# Patient Record
Sex: Female | Born: 1950 | Race: White | Hispanic: No | Marital: Married | Smoking: Former smoker
Health system: Southern US, Community
[De-identification: ages and names within clinical notes are randomized; demographics above are authoritative.]

## PROBLEM LIST (undated history)

## (undated) DIAGNOSIS — Z8669 Personal history of other diseases of the nervous system and sense organs: Secondary | ICD-10-CM

## (undated) DIAGNOSIS — Z87442 Personal history of urinary calculi: Secondary | ICD-10-CM

## (undated) DIAGNOSIS — F329 Major depressive disorder, single episode, unspecified: Secondary | ICD-10-CM

## (undated) DIAGNOSIS — M810 Age-related osteoporosis without current pathological fracture: Secondary | ICD-10-CM

## (undated) DIAGNOSIS — Z8619 Personal history of other infectious and parasitic diseases: Secondary | ICD-10-CM

## (undated) DIAGNOSIS — G43909 Migraine, unspecified, not intractable, without status migrainosus: Secondary | ICD-10-CM

## (undated) DIAGNOSIS — I10 Essential (primary) hypertension: Secondary | ICD-10-CM

## (undated) DIAGNOSIS — Z79899 Other long term (current) drug therapy: Secondary | ICD-10-CM

## (undated) DIAGNOSIS — K635 Polyp of colon: Secondary | ICD-10-CM

## (undated) DIAGNOSIS — E559 Vitamin D deficiency, unspecified: Secondary | ICD-10-CM

## (undated) DIAGNOSIS — E785 Hyperlipidemia, unspecified: Secondary | ICD-10-CM

## (undated) DIAGNOSIS — T7840XA Allergy, unspecified, initial encounter: Secondary | ICD-10-CM

## (undated) DIAGNOSIS — S82899A Other fracture of unspecified lower leg, initial encounter for closed fracture: Secondary | ICD-10-CM

## (undated) DIAGNOSIS — J449 Chronic obstructive pulmonary disease, unspecified: Secondary | ICD-10-CM

## (undated) DIAGNOSIS — K219 Gastro-esophageal reflux disease without esophagitis: Secondary | ICD-10-CM

## (undated) DIAGNOSIS — M542 Cervicalgia: Secondary | ICD-10-CM

## (undated) DIAGNOSIS — G43009 Migraine without aura, not intractable, without status migrainosus: Secondary | ICD-10-CM

## (undated) DIAGNOSIS — R9389 Abnormal findings on diagnostic imaging of other specified body structures: Secondary | ICD-10-CM

## (undated) DIAGNOSIS — F32A Depression, unspecified: Secondary | ICD-10-CM

## (undated) DIAGNOSIS — B019 Varicella without complication: Secondary | ICD-10-CM

## (undated) HISTORY — DX: Cervicalgia: M54.2

## (undated) HISTORY — DX: Migraine, unspecified, not intractable, without status migrainosus: G43.909

## (undated) HISTORY — PX: BREAST ENHANCEMENT SURGERY: SHX7

## (undated) HISTORY — DX: Allergy, unspecified, initial encounter: T78.40XA

## (undated) HISTORY — DX: Depression, unspecified: F32.A

## (undated) HISTORY — DX: Personal history of other infectious and parasitic diseases: Z86.19

## (undated) HISTORY — DX: Gastro-esophageal reflux disease without esophagitis: K21.9

## (undated) HISTORY — DX: Hyperlipidemia, unspecified: E78.5

## (undated) HISTORY — PX: TOTAL VAGINAL HYSTERECTOMY: SHX2548

## (undated) HISTORY — DX: Abnormal findings on diagnostic imaging of other specified body structures: R93.89

## (undated) HISTORY — DX: Chronic obstructive pulmonary disease, unspecified: J44.9

## (undated) HISTORY — PX: BILATERAL SALPINGOOPHORECTOMY: SHX1223

## (undated) HISTORY — DX: Personal history of urinary calculi: Z87.442

## (undated) HISTORY — DX: Other fracture of unspecified lower leg, initial encounter for closed fracture: S82.899A

## (undated) HISTORY — DX: Age-related osteoporosis without current pathological fracture: M81.0

## (undated) HISTORY — DX: Major depressive disorder, single episode, unspecified: F32.9

## (undated) HISTORY — DX: Vitamin D deficiency, unspecified: E55.9

## (undated) HISTORY — DX: Varicella without complication: B01.9

## (undated) HISTORY — DX: Personal history of other diseases of the nervous system and sense organs: Z86.69

## (undated) HISTORY — DX: Migraine without aura, not intractable, without status migrainosus: G43.009

## (undated) HISTORY — DX: Essential (primary) hypertension: I10

## (undated) HISTORY — DX: Other long term (current) drug therapy: Z79.899

## (undated) HISTORY — DX: Polyp of colon: K63.5

---

## 1995-01-27 HISTORY — PX: ANKLE SURGERY: SHX546

## 1995-01-27 HISTORY — PX: APPENDECTOMY: SHX54

## 1995-01-27 HISTORY — PX: VESICOVAGINAL FISTULA CLOSURE W/ TAH: SUR271

## 2001-02-28 ENCOUNTER — Other Ambulatory Visit: Admission: RE | Admit: 2001-02-28 | Discharge: 2001-02-28 | Payer: Self-pay | Admitting: Obstetrics and Gynecology

## 2001-03-10 ENCOUNTER — Encounter: Admission: RE | Admit: 2001-03-10 | Discharge: 2001-03-10 | Payer: Self-pay | Admitting: Obstetrics and Gynecology

## 2001-03-10 ENCOUNTER — Encounter: Payer: Self-pay | Admitting: Obstetrics and Gynecology

## 2001-05-19 ENCOUNTER — Ambulatory Visit (HOSPITAL_COMMUNITY): Admission: RE | Admit: 2001-05-19 | Discharge: 2001-05-19 | Payer: Self-pay | Admitting: Orthopedic Surgery

## 2001-05-19 ENCOUNTER — Encounter: Payer: Self-pay | Admitting: Orthopedic Surgery

## 2002-05-29 ENCOUNTER — Encounter: Payer: Self-pay | Admitting: Emergency Medicine

## 2002-05-29 ENCOUNTER — Emergency Department (HOSPITAL_COMMUNITY): Admission: EM | Admit: 2002-05-29 | Discharge: 2002-05-29 | Payer: Self-pay | Admitting: Emergency Medicine

## 2002-06-14 ENCOUNTER — Encounter: Admission: RE | Admit: 2002-06-14 | Discharge: 2002-06-14 | Payer: Self-pay | Admitting: Family Medicine

## 2002-06-14 ENCOUNTER — Encounter: Payer: Self-pay | Admitting: Family Medicine

## 2004-07-21 ENCOUNTER — Ambulatory Visit (HOSPITAL_COMMUNITY): Admission: RE | Admit: 2004-07-21 | Discharge: 2004-07-21 | Payer: Self-pay | Admitting: Obstetrics and Gynecology

## 2005-12-07 ENCOUNTER — Ambulatory Visit (HOSPITAL_COMMUNITY): Admission: RE | Admit: 2005-12-07 | Discharge: 2005-12-07 | Payer: Self-pay | Admitting: Obstetrics and Gynecology

## 2008-04-09 ENCOUNTER — Encounter: Admission: RE | Admit: 2008-04-09 | Discharge: 2008-04-09 | Payer: Self-pay | Admitting: Family Medicine

## 2008-06-27 ENCOUNTER — Ambulatory Visit (HOSPITAL_COMMUNITY): Admission: RE | Admit: 2008-06-27 | Discharge: 2008-06-27 | Payer: Self-pay | Admitting: Obstetrics and Gynecology

## 2008-07-02 ENCOUNTER — Encounter: Admission: RE | Admit: 2008-07-02 | Discharge: 2008-07-02 | Payer: Self-pay | Admitting: Family Medicine

## 2008-10-05 ENCOUNTER — Encounter: Admission: RE | Admit: 2008-10-05 | Discharge: 2008-10-05 | Payer: Self-pay | Admitting: Family Medicine

## 2008-10-11 ENCOUNTER — Ambulatory Visit: Payer: Self-pay | Admitting: Internal Medicine

## 2008-10-11 DIAGNOSIS — R918 Other nonspecific abnormal finding of lung field: Secondary | ICD-10-CM | POA: Insufficient documentation

## 2008-10-11 DIAGNOSIS — R053 Chronic cough: Secondary | ICD-10-CM | POA: Insufficient documentation

## 2008-10-11 DIAGNOSIS — I10 Essential (primary) hypertension: Secondary | ICD-10-CM | POA: Insufficient documentation

## 2008-10-11 DIAGNOSIS — E785 Hyperlipidemia, unspecified: Secondary | ICD-10-CM | POA: Insufficient documentation

## 2008-10-11 DIAGNOSIS — R930 Abnormal findings on diagnostic imaging of skull and head, not elsewhere classified: Secondary | ICD-10-CM | POA: Insufficient documentation

## 2008-10-11 DIAGNOSIS — R05 Cough: Secondary | ICD-10-CM | POA: Insufficient documentation

## 2008-12-26 DIAGNOSIS — S82899A Other fracture of unspecified lower leg, initial encounter for closed fracture: Secondary | ICD-10-CM

## 2008-12-26 HISTORY — DX: Other fracture of unspecified lower leg, initial encounter for closed fracture: S82.899A

## 2008-12-28 ENCOUNTER — Encounter: Payer: Self-pay | Admitting: Internal Medicine

## 2008-12-28 ENCOUNTER — Ambulatory Visit: Payer: Self-pay | Admitting: Internal Medicine

## 2008-12-28 DIAGNOSIS — J449 Chronic obstructive pulmonary disease, unspecified: Secondary | ICD-10-CM | POA: Insufficient documentation

## 2008-12-28 DIAGNOSIS — J4489 Other specified chronic obstructive pulmonary disease: Secondary | ICD-10-CM | POA: Insufficient documentation

## 2009-01-16 ENCOUNTER — Emergency Department (HOSPITAL_BASED_OUTPATIENT_CLINIC_OR_DEPARTMENT_OTHER): Admission: EM | Admit: 2009-01-16 | Discharge: 2009-01-16 | Payer: Self-pay | Admitting: Emergency Medicine

## 2009-01-16 ENCOUNTER — Ambulatory Visit: Payer: Self-pay | Admitting: Diagnostic Radiology

## 2009-11-18 ENCOUNTER — Ambulatory Visit (HOSPITAL_COMMUNITY)
Admission: RE | Admit: 2009-11-18 | Discharge: 2009-11-18 | Payer: Self-pay | Source: Home / Self Care | Admitting: Family Medicine

## 2010-04-14 ENCOUNTER — Encounter: Payer: Self-pay | Admitting: Internal Medicine

## 2010-04-14 ENCOUNTER — Encounter: Payer: Self-pay | Admitting: Pulmonary Disease

## 2010-04-14 ENCOUNTER — Telehealth (INDEPENDENT_AMBULATORY_CARE_PROVIDER_SITE_OTHER): Payer: Self-pay | Admitting: *Deleted

## 2010-04-14 ENCOUNTER — Ambulatory Visit (INDEPENDENT_AMBULATORY_CARE_PROVIDER_SITE_OTHER): Payer: Federal, State, Local not specified - PPO | Admitting: Pulmonary Disease

## 2010-04-14 ENCOUNTER — Other Ambulatory Visit: Payer: Self-pay | Admitting: Pulmonary Disease

## 2010-04-14 ENCOUNTER — Other Ambulatory Visit: Payer: Federal, State, Local not specified - PPO

## 2010-04-14 ENCOUNTER — Other Ambulatory Visit: Payer: Self-pay | Admitting: *Deleted

## 2010-04-14 ENCOUNTER — Ambulatory Visit (INDEPENDENT_AMBULATORY_CARE_PROVIDER_SITE_OTHER)
Admission: RE | Admit: 2010-04-14 | Discharge: 2010-04-14 | Disposition: A | Discharge status: Home / Self Care | Payer: Federal, State, Local not specified - PPO | Source: Ambulatory Visit | Attending: *Deleted | Admitting: *Deleted

## 2010-04-14 DIAGNOSIS — J189 Pneumonia, unspecified organism: Secondary | ICD-10-CM | POA: Insufficient documentation

## 2010-04-14 DIAGNOSIS — J449 Chronic obstructive pulmonary disease, unspecified: Secondary | ICD-10-CM

## 2010-04-14 DIAGNOSIS — J159 Unspecified bacterial pneumonia: Secondary | ICD-10-CM

## 2010-04-14 DIAGNOSIS — R918 Other nonspecific abnormal finding of lung field: Secondary | ICD-10-CM

## 2010-04-14 LAB — CBC WITH DIFFERENTIAL/PLATELET
Basophils Absolute: 0.1 10*3/uL (ref 0.0–0.1)
Hemoglobin: 12.9 g/dL (ref 12.0–15.0)
Lymphocytes Relative: 10.6 % — ABNORMAL LOW (ref 12.0–46.0)
Monocytes Relative: 5.9 % (ref 3.0–12.0)
Platelets: 469 10*3/uL — ABNORMAL HIGH (ref 150.0–400.0)
RDW: 13.1 % (ref 11.5–14.6)

## 2010-04-14 LAB — BASIC METABOLIC PANEL
CO2: 26 mEq/L (ref 19–32)
Chloride: 98 mEq/L (ref 96–112)
Potassium: 4.1 mEq/L (ref 3.5–5.1)
Sodium: 134 mEq/L — ABNORMAL LOW (ref 135–145)

## 2010-04-14 NOTE — Assessment & Plan Note (Signed)
No meds required,

## 2010-04-14 NOTE — Progress Notes (Signed)
  Subjective:    Patient ID: Yolanda Copeland, female    DOB: December 05, 1950, 60 y.o.   MRN: [id number]  HPI 54 /F quit smoking 12/09 with mild COPD, preserved FEV1, last seen 12/10  Pulm nodules noted on CT in 12/10 04/14/2010  - Acute visit Fevers about 10 ds ago - viral syndrome per dr Ferol Luz Sick x 1 week, saw dr Clyde Canterbury - z-pak x 3ds, high temp - taking advil, mucinex - flecks of green  Low BP  Off losartan now  COntinued to feel worse    Review of Systems  Constitutional: Positive for fever and diaphoresis. Negative for weight loss.  HENT: Negative for nosebleeds, congestion, sore throat and ear discharge.   Eyes: Negative for pain and discharge.  Respiratory: Positive for cough, sputum production and shortness of breath. Negative for hemoptysis and wheezing.   Cardiovascular: Negative for chest pain, palpitations and leg swelling.  Gastrointestinal: Negative for nausea, vomiting and abdominal pain.  Genitourinary: Negative for dysuria.  Musculoskeletal: Negative for back pain and joint pain.  Skin: Negative for rash.  Neurological: Positive for weakness. Negative for focal weakness.   Review of Systems  Constitutional: Positive for fever and diaphoresis. Negative for weight loss.  HENT: Negative for nosebleeds, congestion, sore throat and ear discharge.   Eyes: Negative for pain and discharge.  Respiratory: Positive for cough, sputum production and shortness of breath. Negative for hemoptysis and wheezing.   Cardiovascular: Negative for chest pain, palpitations and leg swelling.  Gastrointestinal: Negative for nausea, vomiting and abdominal pain.  Genitourinary: Negative for dysuria.  Musculoskeletal: Negative for back pain and joint pain.  Skin: Negative for rash.  Neurological: Positive for weakness. Negative for focal weakness.       Objective:   Physical Exam Gen. Pleasant, well-nourished, in no distress, normal affect ENT - no lesions, no post nasal drip Neck: No  JVD, no thyromegaly, no carotid bruits Lungs: no use of accessory muscles, no dullness to percussion, clear without rales or rhonchi  Cardiovascular: Rhythm regular, heart sounds  normal, no murmurs or gallops, no peripheral edema Abdomen: soft and non-tender, no hepatosplenomegaly, BS normal. Musculoskeletal: No deformities, no cyanosis or clubbing Neuro:  alert, non focal        Assessment & Plan:

## 2010-04-14 NOTE — Patient Instructions (Signed)
1)  Copy sent to: 2)  Chest Xray shows pneumonia in right lung 3)  Antibiotic shot 4)  Take pills x 10 days 5)  Return in 2 days 6)  Call if Bp remains low or if worse 7)  Plenty of oral fluids 8)  Stay off losartan & advil 9)  Take tylenol for fevers

## 2010-04-14 NOTE — Assessment & Plan Note (Signed)
RML, Ceftriaxone IM now Levaquin 500 x 10 ds FU CXR & evaln in 2-3 days CBC, BMET now COntinue to hold losartan Call if worse, may need admission , if no response in 2 days- hope to defervesce

## 2010-04-14 NOTE — Assessment & Plan Note (Signed)
Will need FU CT chest once pneumonia resolved

## 2010-04-16 ENCOUNTER — Ambulatory Visit (INDEPENDENT_AMBULATORY_CARE_PROVIDER_SITE_OTHER): Payer: Federal, State, Local not specified - PPO | Admitting: Pulmonary Disease

## 2010-04-16 ENCOUNTER — Encounter: Payer: Self-pay | Admitting: Pulmonary Disease

## 2010-04-16 ENCOUNTER — Ambulatory Visit (INDEPENDENT_AMBULATORY_CARE_PROVIDER_SITE_OTHER)
Admission: RE | Admit: 2010-04-16 | Discharge: 2010-04-16 | Disposition: A | Discharge status: Home / Self Care | Payer: Federal, State, Local not specified - PPO | Source: Ambulatory Visit | Attending: Pulmonary Disease | Admitting: Pulmonary Disease

## 2010-04-16 VITALS — BP 106/64 | HR 107 | Temp 99.4°F | Ht 65.0 in | Wt 142.0 lb

## 2010-04-16 DIAGNOSIS — I10 Essential (primary) hypertension: Secondary | ICD-10-CM

## 2010-04-16 DIAGNOSIS — J189 Pneumonia, unspecified organism: Secondary | ICD-10-CM

## 2010-04-16 DIAGNOSIS — R059 Cough, unspecified: Secondary | ICD-10-CM

## 2010-04-16 DIAGNOSIS — R05 Cough: Secondary | ICD-10-CM

## 2010-04-16 NOTE — Assessment & Plan Note (Signed)
Symptomatic RX - mucinex DM daytime, ok for delsym at bedtime

## 2010-04-16 NOTE — Assessment & Plan Note (Signed)
Hold losartan until BP improves

## 2010-04-16 NOTE — Patient Instructions (Signed)
Return in 1 week with Tammy or me Stay off Bp medication Tylenol as needed for T 101 & above Take Mucinex daytime & DELSYM at bedtime

## 2010-04-16 NOTE — Assessment & Plan Note (Signed)
Improved in 48 h by symptoms Rpt CXR today Stay on levaquin

## 2010-04-16 NOTE — Progress Notes (Signed)
  Subjective:    Patient ID: Yolanda Copeland, female    DOB: Nov 21, 1950, 60 y.o.   MRN: [id number]  HPI59 /F quit smoking 12/09 with mild COPD, preserved FEV1, last seen 12/10  Pulm nodules noted on CT in 12/10  04/14/2010  - Acute visit Fevers about 10 ds ago - viral syndrome per dr Ferol Luz Sick x 1 week, saw dr Clyde Canterbury - z-pak x 3ds, high temp - taking advil, mucinex - flecks of green  Low BP  Off losartan now  CXR >> RML consolidation >> Im rocephin & PO levaquin, WC 15k  04/16/2010  Feeling better, temp down max of 99.8 , cough with white,foul smelling sputum No chest pain, dyspnea, not having to take advil, remians off losartan , BP improving   Review of Systems  Constitutional: Positive for fever. Negative for chills, appetite change and unexpected weight change.  HENT: Negative for mouth sores, voice change and postnasal drip.   Eyes: Negative for discharge.  Respiratory: Positive for cough. Negative for chest tightness, shortness of breath and wheezing.   Cardiovascular: Negative for chest pain, palpitations and leg swelling.  Gastrointestinal: Negative for abdominal pain.  Genitourinary: Negative for dysuria and frequency.  Musculoskeletal: Negative for joint swelling.  Skin: Negative for rash.  Neurological: Negative for syncope and headaches.  Psychiatric/Behavioral: Negative for confusion. The patient is not nervous/anxious.        Objective:   Physical Exam Gen. Pleasant, well-nourished, in no distress, normal affect ENT - no lesions, no post nasal drip Neck: No JVD, no thyromegaly, no carotid bruits Lungs: no use of accessory muscles, no dullness to percussion, clear without rales or rhonchi , increased vocal resonance rt base Cardiovascular: Rhythm regular, heart sounds  normal, no murmurs or gallops, no peripheral edema Abdomen: soft and non-tender, no hepatosplenomegaly, BS normal. Musculoskeletal: No deformities, no cyanosis or clubbing Neuro:  alert, non  focal         Assessment & Plan:

## 2010-04-24 NOTE — Progress Notes (Signed)
Summary: appt.  Phone Note Call from Patient Call back at [facility] Phone [phone]   Caller: Patient Call For: wert Reason for Call: Talk to Nurse Summary of Call: Patient c/o fever and cough x1 week.  She saw her primary care doctor last Wed and was told she has pneumonia--give zpack.  Patient calling saying she is not any better this morning and is requesting appt. Initial call taken by: Lehman Prom,  April 14, 2010 8:21 AM  Follow-up for Phone Call        Called, spoke with pt.  c/o fever, nonprod cough, sweats, chest tightness, wheezing, increased SOB.  Sxs started on Wednesday.  She was seen by PCP on Wednesday and Friday and was told she has pna.  States she was given z pak but not feeling any better.  Requesting to be seen.  Scheduled with RA today as MW has no openings.   Follow-up by: Gweneth Dimitri RN,  April 14, 2010 9:47 AM

## 2010-04-24 NOTE — Assessment & Plan Note (Signed)
Summary: COUGH, FEVER, DX WITH PNA BY PCP/cj/LED   Visit Type:  Acute   CC:  Wert pt. c/o productive cough, fever T-102, fatigue, sweats, and DOE.  Preventive Screening-Counseling & Management  Alcohol-Tobacco     Smoking Status: quit  Current Medications (verified): 1)  Centrum Silver  Tabs (Multiple Vitamins-Minerals) .Marland Kitchen.. 1 Once Daily 2)  Viactiv 500-500-40 Mg-Unt-Mcg Chew (Calcium-Vitamin D-Vitamin K) .... Take 1 Tablet By Mouth Two Times A Day 3)  Vitamin D3 2000 Unit Caps (Cholecalciferol) .Marland Kitchen.. 1 Once Daily 4)  Losartan Potassium 50 Mg Tabs (Losartan Potassium) .Marland Kitchen.. 1 Once Daily 5)  Estradiol 1 Mg Tabs (Estradiol) .Marland Kitchen.. 1 Once Daily 6)  Lipitor 20 Mg Tabs (Atorvastatin Calcium) .Marland Kitchen.. 1 Once Daily 7)  Advil Pm 200-38 Mg Tabs (Ibuprofen-Diphenhydramine Cit) .... Take 1 Tab By Mouth At Bedtime 8)  Boniva 150 Mg Tabs (Ibandronate Sodium) .... Once A Month  Allergies (verified): No Known Drug Allergies  Social History: Smoking Status:  quit  Vital Signs:  Patient profile:   60 year old female Height:      65 inches Weight:      143.2 pounds BMI:     23.92 O2 Sat:      95 % on Room air Temp:     101.3 degrees F oral Pulse rate:   125 / minute BP sitting:   90 / 70  (left arm) Cuff size:   regular  Vitals Entered By: Zackery Barefoot CMA (April 14, 2010 11:17 AM)  O2 Flow:  Room air CC: Wert pt. c/o productive cough, fever T-102, fatigue, sweats, DOE Comments Medications reviewed with patient Verified contact number and pharmacy with patient Zackery Barefoot CMA  April 14, 2010 11:17 AM     Medications Added to Medication List This Visit: 1)  Viactiv 500-500-40 Mg-unt-mcg Chew (Calcium-vitamin d-vitamin k) .... Take 1 tablet by mouth two times a day 2)  Advil Pm 200-38 Mg Tabs (Ibuprofen-diphenhydramine cit) .... Take 1 tab by mouth at bedtime 3)  Boniva 150 Mg Tabs (Ibandronate sodium) .... Once a month 4)  Levaquin 500 Mg Tabs (Levofloxacin) .... Once  daily  Other Orders: T-2 View CXR (71020TC) TLB-CBC Platelet - w/Differential (85025-CBCD) TLB-BMP (Basic Metabolic Panel-BMET) (80048-METABOL) Est. Patient Level V (95621) Prescription Created Electronically [id number]  Patient Instructions: 1)  Copy sent to: 2)  Chest Xray shows pneumonia in right lung 3)  Antibiotic shot 4)  Take pills x 10 days 5)  Return in 2 days 6)  Call if Bp remains low or if worse 7)  Plenty of oral fluids 8)  Stay off losartan & advil 9)  Take tylenol for fevers Prescriptions: LEVAQUIN 500 MG TABS (LEVOFLOXACIN) once daily  #10 x 0   Entered and Authorized by:   Comer Locket Vassie Loll MD   Signed by:   Comer Locket Vassie Loll MD on 04/14/2010   Method used:   Electronically to        [id number] ([facility])       [address]       [place], [place]  [address]       Ph: [phone] or [phone]       Fax: [phone]   RxID:   [id number]    Immunization History:  Influenza Immunization History:    Influenza:  historical (11/26/2009)   Appended Document: Orders Update    Clinical Lists Changes  Orders: Added new Service order of Rocephin  250mg  (W1191) - Signed Added new Service order of Admin of Therapeutic Inj  intramuscular or subcutaneous ([id number]) - Signed       Medication Administration  Injection # 1:    Medication: Rocephin  250mg     Diagnosis: BACTERIAL PNEUMONIA (ICD-482.9)    Route: IM    Site: LUOQ gluteus    Exp Date: 05/2011    Lot #: [id number]    Mfr: Novaplus    Patient tolerated injection without complications    Given by: Zackery Barefoot CMA (April 14, 2010 12:34 PM)  Orders Added: 1)  Rocephin  250mg  [[id number]] 2)  Admin of Therapeutic Inj  intramuscular or subcutaneous [[id number]]

## 2010-04-29 ENCOUNTER — Ambulatory Visit (INDEPENDENT_AMBULATORY_CARE_PROVIDER_SITE_OTHER): Payer: Federal, State, Local not specified - PPO | Admitting: Adult Health

## 2010-04-29 ENCOUNTER — Encounter: Payer: Self-pay | Admitting: Adult Health

## 2010-04-29 ENCOUNTER — Ambulatory Visit (INDEPENDENT_AMBULATORY_CARE_PROVIDER_SITE_OTHER)
Admission: RE | Admit: 2010-04-29 | Discharge: 2010-04-29 | Disposition: A | Discharge status: Home / Self Care | Payer: Federal, State, Local not specified - PPO | Source: Ambulatory Visit | Attending: Adult Health | Admitting: Adult Health

## 2010-04-29 VITALS — BP 118/60 | HR 72 | Temp 98.3°F | Ht 65.0 in | Wt 140.8 lb

## 2010-04-29 DIAGNOSIS — J189 Pneumonia, unspecified organism: Secondary | ICD-10-CM

## 2010-04-29 DIAGNOSIS — I10 Essential (primary) hypertension: Secondary | ICD-10-CM

## 2010-04-29 NOTE — Progress Notes (Signed)
  Subjective:    Patient ID: Yolanda Copeland, female    DOB: 02/04/1950, 60 y.o.   MRN: [id number]  HPI  Subjective:   59/F quit smoking 12/09 with mild COPD, preserved FEV1, last seen 12/10  Pulm nodules noted on CT in 12/10  04/14/2010  - Acute visit Fevers about 10 ds ago - viral syndrome per dr Ferol Luz Sick x 1 week, saw dr Clyde Canterbury - z-pak x 3ds, high temp - taking advil, mucinex - flecks of green  Low BP  Off losartan now  CXR >> RML consolidation >> Im rocephin & PO levaquin, WC 15k  04/16/2010 Follow Up  Feeling better, temp down max of 99.8 , cough with white,foul smelling sputum  04/29/10 Follow up  Pt returns today for follow up of PNA .She is feeling better with decreased cough and congestion. NO fever  Or discolored mucus. Last office visit dx with RML PNA , She has finished her full course of Levaquin. Appetite is improved.  Still feels tired but not as weak. Blood pressure had been low on initial visit. Losartan was held.  Her b/p has been improved on average ~100-120 systolic. No chest pain, hemoptysis or abdominal pain.   ROS:   Constitutional:   No  weight loss, night sweats,  Fevers, chills, fatigue, or  lassitude.  HEENT:   No headaches,  Difficulty swallowing,  Tooth/dental problems, or  Sore throat,                No sneezing, itching, ear ache, nasal congestion, post nasal drip,   CV:  No chest pain,  Orthopnea, PND, swelling in lower extremities, anasarca, dizziness, palpitations, syncope.   GI  No heartburn, indigestion, abdominal pain, nausea, vomiting, diarrhea, change in bowel habits, loss of appetite, bloody stools.   Resp: No shortness of breath with exertion or at rest.    No coughing up of blood.  No change in color of mucus.  No wheezing.  No chest wall deformity  Skin: no rash or lesions.  GU: no dysuria, change in color of urine, no urgency or frequency.  No flank pain, no hematuria   MS:  No joint pain or swelling.  No decreased range of  motion.  No back pain.  Psych:  No change in mood or affect. No depression or anxiety.  No memory loss.              Objective:   Physical Exam Gen. Pleasant, well-nourished, in no distress, normal affect ENT - no lesions, no post nasal drip Neck: No JVD, no thyromegaly, no carotid bruits Lungs: no use of accessory muscles, no dullness to percussion, clear without rales or rhonchi Cardiovascular: Rhythm regular, heart sounds  normal, no murmurs or gallops, no peripheral edema Abdomen: soft and non-tender, no hepatosplenomegaly, BS normal. Musculoskeletal: No deformities, no cyanosis or clubbing Neuro:  alert, non focal         Assessment & Plan:   Review of Systems     Objective:   Physical Exam          Assessment & Plan:

## 2010-04-29 NOTE — Assessment & Plan Note (Addendum)
Clinically improving. NOw finished full course of abx.  Will recheck CXR  Plan:  Cont on current regimen.  Follow cxr for clearance follow up 4 weeks and As needed

## 2010-04-29 NOTE — Patient Instructions (Addendum)
May restart Losartan 50mg  1/2 daily -call if blood pressure is >140 or <90  I will call with xray results.  follow up Dr. Vassie Loll   In 4 weeks and As needed

## 2010-05-04 NOTE — Assessment & Plan Note (Signed)
Improved  Can restart Losartan  follow up as planned

## 2010-05-05 ENCOUNTER — Telehealth: Payer: Self-pay | Admitting: Adult Health

## 2010-05-05 NOTE — Progress Notes (Signed)
Pt aware of cxr results per 05-05-10 phone note.

## 2010-05-05 NOTE — Telephone Encounter (Signed)
Spoke w/ pt and advised her of cxr results. Pt verbalized understanding. Pt has been seeing Dr. Vassie Loll and Dr. Sherene Sires. Pt has an apt w/ Dr. Sherene Sires 4/12 and 5/1 and w/ Dr. Vassie Loll 5/3. Pt is wanting to know which doctor she needs to stick w/ so she is not seeing 2 docs for the same thing. Pt also states she is still having night sweats. Pt states she will be out later and states we can leave her a detailed VM. Please advise Yolanda Copeland. Thanks.

## 2010-05-05 NOTE — Telephone Encounter (Signed)
Per TP: only keep the appt w/ MW on 05-27-10.  LMOM to inform pt what appt to keep.  Other appts cancelled.  Encouraged pt to call with any questions/concerns.  Will close encounter.

## 2010-05-06 ENCOUNTER — Encounter: Payer: Self-pay | Admitting: Internal Medicine

## 2010-05-08 ENCOUNTER — Ambulatory Visit: Payer: Federal, State, Local not specified - PPO | Admitting: Internal Medicine

## 2010-05-26 ENCOUNTER — Encounter: Payer: Self-pay | Admitting: Internal Medicine

## 2010-05-27 ENCOUNTER — Ambulatory Visit (INDEPENDENT_AMBULATORY_CARE_PROVIDER_SITE_OTHER): Payer: Federal, State, Local not specified - PPO | Admitting: Internal Medicine

## 2010-05-27 ENCOUNTER — Ambulatory Visit (INDEPENDENT_AMBULATORY_CARE_PROVIDER_SITE_OTHER)
Admission: RE | Admit: 2010-05-27 | Discharge: 2010-05-27 | Disposition: A | Discharge status: Home / Self Care | Payer: Federal, State, Local not specified - PPO | Source: Ambulatory Visit | Attending: Internal Medicine | Admitting: Internal Medicine

## 2010-05-27 ENCOUNTER — Encounter: Payer: Self-pay | Admitting: Internal Medicine

## 2010-05-27 VITALS — BP 120/60 | HR 78 | Temp 97.9°F | Wt 144.0 lb

## 2010-05-27 DIAGNOSIS — R918 Other nonspecific abnormal finding of lung field: Secondary | ICD-10-CM

## 2010-05-27 DIAGNOSIS — J159 Unspecified bacterial pneumonia: Secondary | ICD-10-CM

## 2010-05-27 DIAGNOSIS — R05 Cough: Secondary | ICD-10-CM

## 2010-05-27 DIAGNOSIS — R059 Cough, unspecified: Secondary | ICD-10-CM

## 2010-05-27 NOTE — Patient Instructions (Addendum)
Chlortrimeton 4 mg one at bedtime   Delsym cough syrup  Pepcid 20 mg one after bfast and at bedtime until no longer coughing at all.  I will call with your cxr results  You call me if not 100% satisfied with cough  Late Add:  Serial cxr's reviewed  - needs f/u cxr at 3 months and yearly, pt informed by phone

## 2010-05-27 NOTE — Progress Notes (Signed)
  Subjective:    Patient ID: Yolanda Copeland, female    DOB: 1950/06/09, 60 y.o.   MRN: [id number]  HPI Copy to: Dr. Shaune Pollack  .   58 yowf quit smoking 12/2007 then developed chronic cough a month after she quit.   October 11, 2008 ov c/o occ tickle  after rx with augmentin x 7 days in July 2010 > much better overall.   04/14/2010 - Acute visit  Fevers about 10 ds ago - viral syndrome per dr Ferol Luz  Sick x 1 week, saw dr Clyde Canterbury - z-pak x 3ds, high temp - taking advil, mucinex - flecks of green  Low BP Off losartan now  CXR >> RML consolidation >> Im rocephin & PO levaquin, WC 15k  rec complete Levaquin    04/29/10 ov  Cc f/u  PNA > better with decreased cough and congestion. NO fever  Or discolored mucus. Last office visit dx with RML PNA , She has finished her full course of Levaquin. Appetite is improved.  Still feels tired but not as weak. Blood pressure had been low on initial visit. Losartan was held.  Her b/p has been improved on average ~100-120 systolic. No chest pain, hemoptysis or abdominal pain. rec  restart Losartan 50mg  1/2 daily -call if blood pressure is >140 or <90  05/27/2010 ov/ Amato [place]  Cc cough still present but better > like a tickle in throat, minimal clear mucus, worse with crumbly food like carckers .  Sleeping ok without nocturnal  or early am exac of resp c/o's or need for noct saba.   Pt denies any significant sore throat, dysphagia, itching, sneezing,  nasal congestion or excess/ purulent secretions,  fever, chills, sweats, unintended wt loss, pleuritic or exertional cp, hempoptysis, orthopnea pnd or leg swelling.    Also denies any obvious fluctuation of symptoms with weather or environmental changes or other aggravating or alleviating factors.    Past Medical History:  Hyperlipidemia  Hypertension  ABN CXR  - No baseline available  - [place]ived in [place] previously   Past Surgical History:  Hysterectomy 1997  Breast augmentation x 2 1987 and in 2007    Appendectomy 1997   Family History:  Heart dz- Father  Social History:  Married  [employer]  Former smoker. Quit Dec 2009. Smoked on and off x 40 years up to 1/2 ppd.  Occ ETOH       Review of Systems     Objective:   Physical Exam  wt 143  >   144 05/27/2010  thin anxious but pleasant amb wf nad  HEENT: nl dentition, turbinates, and orophanx. Nl external ear canals without cough reflex  NECK : without JVD/Nodes/TM/ nl carotid upstrokes bilaterally  LUNGS: no acc muscle use, clear to A and P bilaterally without cough on insp or exp maneuvers  CV: RRR no s3 or murmur or increase in P2, no edema  ABD: soft and nontender with nl excursion in the supine position. No bruits or organomegaly, bowel sounds nl  MS: warm without deformities, calf tenderness, cyanosis or clubbing  SKIN: warm and dry without lesions  NEURO: alert, approp, no deficit     cxr 05/27/10 1. Decrease in size of vague right middle lobe opacity most likely  the sequelae of prior pneumonia.   Pulmonary review:  No further f/u needed   Assessment & Plan:

## 2010-05-27 NOTE — Progress Notes (Signed)
Quick Note:  Spoke with pt and notified of results per Dr. Wert. Pt verbalized understanding and denied any questions.  ______ 

## 2010-05-28 ENCOUNTER — Encounter: Payer: Self-pay | Admitting: Internal Medicine

## 2010-05-28 NOTE — Assessment & Plan Note (Signed)
The most common causes of chronic cough in immunocompetent adults include the following: upper airway cough syndrome (UACS), previously referred to as postnasal drip syndrome (PNDS), which is caused by variety of rhinosinus conditions; (2) asthma; (3) GERD; (4) chronic bronchitis from cigarette smoking or other inhaled environmental irritants; (5) nonasthmatic eosinophilic bronchitis; and (6) bronchiectasis.   These conditions, singly or in combination, have accounted for up to 94% of the causes of chronic cough in prospective studies.   Other conditions have constituted no >6% of the causes in prospective studies These have included bronchogenic carcinoma, chronic interstitial pneumonia, sarcoidosis, left ventricular failure, ACEI-induced cough, and aspiration from a condition associated with pharyngeal dysfunction.    This is most c/w  Classic Upper airway cough syndrome, so named because it's frequently impossible to sort out how much is  CR/sinusitis with freq throat clearing (which can be related to primary GERD)   vs  causing  secondary (" extra esophageal")  GERD from wide swings in gastric pressure that occur with throat clearing, often  promoting self use of mint and menthol lozenges that reduce the lower esophageal sphincter tone and exacerbate the problem further in a cyclical fashion.   These are the same pts who not infrequently have failed to tolerate ace inhibitors,  dry powder inhalers or biphosphonates or report having reflux symptoms that don't respond to standard doses of PPI , and are easily confused as having aecopd or asthma flares,   Try h1 Antihistamines  per guidelines and f/u if not 100% satisfied with ct sinus next

## 2010-05-28 NOTE — Assessment & Plan Note (Signed)
Serial cxr's show dramatic convincing response to rx with minimal rml residual, so ok to f/u prn cough at this point

## 2010-05-29 ENCOUNTER — Ambulatory Visit: Payer: Federal, State, Local not specified - PPO | Admitting: Pulmonary Disease

## 2010-12-26 ENCOUNTER — Other Ambulatory Visit: Payer: Self-pay | Admitting: Obstetrics and Gynecology

## 2010-12-26 DIAGNOSIS — Z1231 Encounter for screening mammogram for malignant neoplasm of breast: Secondary | ICD-10-CM

## 2011-01-30 ENCOUNTER — Ambulatory Visit (HOSPITAL_COMMUNITY)
Admission: RE | Admit: 2011-01-30 | Discharge: 2011-01-30 | Disposition: A | Discharge status: Home / Self Care | Payer: Federal, State, Local not specified - PPO | Source: Ambulatory Visit | Attending: Obstetrics and Gynecology | Admitting: Obstetrics and Gynecology

## 2011-01-30 DIAGNOSIS — Z1231 Encounter for screening mammogram for malignant neoplasm of breast: Secondary | ICD-10-CM | POA: Insufficient documentation

## 2011-07-10 ENCOUNTER — Other Ambulatory Visit: Payer: Self-pay | Admitting: Obstetrics and Gynecology

## 2011-07-10 DIAGNOSIS — N63 Unspecified lump in unspecified breast: Secondary | ICD-10-CM

## 2011-07-20 ENCOUNTER — Ambulatory Visit
Admission: RE | Admit: 2011-07-20 | Discharge: 2011-07-20 | Disposition: A | Discharge status: Home / Self Care | Payer: Federal, State, Local not specified - PPO | Source: Ambulatory Visit | Attending: Obstetrics and Gynecology | Admitting: Obstetrics and Gynecology

## 2011-07-20 DIAGNOSIS — N63 Unspecified lump in unspecified breast: Secondary | ICD-10-CM

## 2012-03-11 DIAGNOSIS — M25511 Pain in right shoulder: Secondary | ICD-10-CM | POA: Insufficient documentation

## 2012-10-21 ENCOUNTER — Other Ambulatory Visit: Payer: Self-pay | Admitting: Obstetrics and Gynecology

## 2012-10-21 DIAGNOSIS — Z1231 Encounter for screening mammogram for malignant neoplasm of breast: Secondary | ICD-10-CM

## 2012-10-31 ENCOUNTER — Ambulatory Visit (HOSPITAL_COMMUNITY)
Admission: RE | Admit: 2012-10-31 | Discharge: 2012-10-31 | Disposition: A | Discharge status: Home / Self Care | Payer: Federal, State, Local not specified - PPO | Source: Ambulatory Visit | Attending: Obstetrics and Gynecology | Admitting: Obstetrics and Gynecology

## 2012-10-31 DIAGNOSIS — Z1231 Encounter for screening mammogram for malignant neoplasm of breast: Secondary | ICD-10-CM

## 2013-03-10 ENCOUNTER — Encounter: Payer: Self-pay | Admitting: Gynecology

## 2013-03-29 ENCOUNTER — Ambulatory Visit: Payer: Self-pay | Admitting: Gynecology

## 2013-04-03 ENCOUNTER — Encounter: Payer: Self-pay | Admitting: Gynecology

## 2013-04-04 ENCOUNTER — Ambulatory Visit (INDEPENDENT_AMBULATORY_CARE_PROVIDER_SITE_OTHER): Payer: Federal, State, Local not specified - PPO | Admitting: Gynecology

## 2013-04-04 ENCOUNTER — Encounter: Payer: Self-pay | Admitting: Gynecology

## 2013-04-04 VITALS — BP 114/66 | HR 68 | Resp 18 | Ht 65.0 in | Wt 171.0 lb

## 2013-04-04 DIAGNOSIS — N951 Menopausal and female climacteric states: Secondary | ICD-10-CM

## 2013-04-04 DIAGNOSIS — F329 Major depressive disorder, single episode, unspecified: Secondary | ICD-10-CM | POA: Insufficient documentation

## 2013-04-04 DIAGNOSIS — Z0000 Encounter for general adult medical examination without abnormal findings: Secondary | ICD-10-CM

## 2013-04-04 DIAGNOSIS — F32A Depression, unspecified: Secondary | ICD-10-CM | POA: Insufficient documentation

## 2013-04-04 DIAGNOSIS — Z01419 Encounter for gynecological examination (general) (routine) without abnormal findings: Secondary | ICD-10-CM

## 2013-04-04 DIAGNOSIS — M810 Age-related osteoporosis without current pathological fracture: Secondary | ICD-10-CM

## 2013-04-04 LAB — POCT URINALYSIS DIPSTICK
PH UA: 5
UROBILINOGEN UA: NEGATIVE

## 2013-04-04 MED ORDER — ESTRADIOL 1 MG PO TABS: 1.0000 mg | ORAL_TABLET | Freq: Every day | ORAL | Status: DC

## 2013-04-04 NOTE — Progress Notes (Addendum)
63 y.o. Married Caucasian female   [phone] here for annual exam. Pt reports menses are absent due to Hysterectomy. She does not report post-menopasual bleeding. Pt stopped boniva after there was question regarding needing a dental implant but ended up not needing now.  Pt has an atraumatic fracture history.  Pt denies dyspareunia.     No LMP recorded. Patient has had a hysterectomy.          Sexually active: yes  The current method of family planning is status post hysterectomy.    Exercising: no  The patient does not participate in regular exercise at present. Last pap: 02/28/2001 Negative Abnormal PAP: no Mammogram: 10/31/12 Bi-Rads 1  BSE: yes  Colonoscopy: 2009 Normal  q 5 years DEXA: 2010/2011 Alcohol: 2-3 drinks/wk Tobacco: no  Labs: Darcus Austin, MD ; Urine: Leuks 2  Health Maintenance  Topic Date Due  . Colonoscopy  09/26/2002  . Pap Smear  02/29/2004  . Tetanus/tdap  01/27/2011  . Influenza Vaccine  08/26/2012  . Zostavax  09/25/2012  . Mammogram  11/01/2014    Family History  Problem Relation Age of Onset  . Heart disease Father   . Diabetes Maternal Grandmother   . Hypertension Father     Patient Active Problem List   Diagnosis Date Noted  . BACTERIAL PNEUMONIA 04/14/2010  . COPD UNSPECIFIED 12/28/2008  . HYPERLIPIDEMIA 10/11/2008  . HYPERTENSION 10/11/2008  . Cough 10/11/2008  . Multiple pulmonary nodules 10/11/2008  . ABNORMAL LUNG XRAY 10/11/2008    Past Medical History  Diagnosis Date  . Hyperlipemia   . Hypertension   . Abnormal CXR   . COPD (chronic obstructive pulmonary disease)   . Migraine   . Broken ankle 12/10    atraumatic fracture     Past Surgical History  Procedure Laterality Date  . Vesicovaginal fistula closure w/ tah  1997  . Breast enhancement surgery  1989, 2003    x2  . Appendectomy  1997  . Ankle surgery  1997    plate fell   . Total vaginal hysterectomy  1999    menorrhagia; suspicious cells in uterus     Allergies:  Review of patient's allergies indicates no known allergies.  Current Outpatient Prescriptions  Medication Sig Dispense Refill  . acetaminophen (TYLENOL) 500 MG tablet Take 500 mg by mouth every 6 (six) hours as needed.        Marland Kitchen atorvastatin (LIPITOR) 20 MG tablet Take 20 mg by mouth daily.        . calcium citrate-vitamin D (CITRACAL+D) 315-200 MG-UNIT per tablet Take 1 tablet by mouth daily.        . Cholecalciferol (VITAMIN D3) 2000 UNITS TABS Once daily       . DULoxetine HCl (CYMBALTA PO) Take by mouth.      . estradiol (ESTRACE) 1 MG tablet Take 1 mg by mouth daily.        Marland Kitchen ibandronate (BONIVA) 150 MG tablet Take 150 mg by mouth every 30 (thirty) days. Take in the morning with a full glass of water, on an empty stomach, and do not take anything else by mouth or lie down for the next 30 min.       Marland Kitchen losartan (COZAAR) 50 MG tablet Take 50 mg by mouth daily.        . Multiple Vitamins-Minerals (CENTRUM SILVER PO) Once daily       . TRAZODONE HCL PO Take by mouth.       No current  facility-administered medications for this visit.    ROS: Pertinent items are noted in HPI.  Exam:    BP 114/66  Pulse 68  Resp 18  Ht 5\' 5"  (1.651 m)  Wt 171 lb (77.565 kg)  BMI 28.46 kg/m2 Weight change: @WEIGHTCHANGE @ Last 3 height recordings:  Ht Readings from Last 3 Encounters:  04/04/13 5\' 5"  (1.651 m)  04/29/10 5\' 5"  (1.651 m)  04/16/10 5\' 5"  (1.651 m)   General appearance: alert, cooperative and appears stated age Head: Normocephalic, without obvious abnormality, atraumatic Neck: no adenopathy, no carotid bruit, no JVD, supple, symmetrical, trachea midline and thyroid not enlarged, symmetric, no tenderness/mass/nodules Lungs: clear to auscultation bilaterally Breasts: normal appearance, no masses or tenderness, implants in place Heart: regular rate and rhythm, S1, S2 normal, no murmur, click, rub or gallop Abdomen: soft, non-tender; bowel sounds normal; no masses,  no  organomegaly Extremities: extremities normal, atraumatic, no cyanosis or edema Skin: Skin color, texture, turgor normal. No rashes or lesions Lymph nodes: Cervical, supraclavicular, and axillary nodes normal. no inguinal nodes palpated Neurologic: Grossly normal   Pelvic: External genitalia:  no lesions              Urethra: normal appearing urethra with no masses, tenderness or lesions              Bartholins and Skenes: Bartholin's, Urethra, Skene's normal                 Vagina: normal appearing vagina with normal color and discharge, no lesions              Cervix: absent              Pap taken: no        Bimanual Exam:  Uterus:  absent                                      Adnexa:    surgically absent bilateral                                      Rectovaginal: Confirms                                      Anus:  normal sphincter tone, no lesions  A: well woman Osteoporosis Menopausal symptoms     P: mammogram annually Refill estrace Recommend f/u re osteoporosis counseled on breast self exam, mammography screening, menopause, osteoporosis, adequate intake of calcium and vitamin D, diet and exercise return annually or prn Discussed PAP guideline changes, importance of weight bearing exercises, calcium, vit D and balanced diet.  An After Visit Summary was printed and given to the patient.   04/10/13: reviewed DEXA 2011: osteoporosis noted in both hips, left neck and total -2.9/-2.5, right -2.5/-2.0, radius -3.8

## 2013-04-04 NOTE — Patient Instructions (Signed)

## 2013-04-05 ENCOUNTER — Telehealth: Payer: Self-pay | Admitting: Gynecology

## 2013-04-05 DIAGNOSIS — M810 Age-related osteoporosis without current pathological fracture: Secondary | ICD-10-CM

## 2013-04-05 NOTE — Telephone Encounter (Signed)
Patient with hx osteoporosis, last dexa at [place], patient requesting records. Dexa ordered. Patient aware, will call for appointment.   Routing to provider for final review. Patient agreeable to disposition. Will close encounter

## 2013-04-05 NOTE — Telephone Encounter (Signed)
[facility]

## 2013-04-21 ENCOUNTER — Ambulatory Visit (HOSPITAL_COMMUNITY)
Admission: RE | Admit: 2013-04-21 | Discharge: 2013-04-21 | Disposition: A | Discharge status: Home / Self Care | Payer: Federal, State, Local not specified - PPO | Source: Ambulatory Visit | Attending: Gynecology | Admitting: Gynecology

## 2013-04-21 DIAGNOSIS — Z13820 Encounter for screening for osteoporosis: Secondary | ICD-10-CM | POA: Insufficient documentation

## 2013-04-21 DIAGNOSIS — Z780 Asymptomatic menopausal state: Secondary | ICD-10-CM | POA: Insufficient documentation

## 2013-04-21 DIAGNOSIS — M810 Age-related osteoporosis without current pathological fracture: Secondary | ICD-10-CM

## 2013-04-26 ENCOUNTER — Telehealth: Payer: Self-pay | Admitting: Emergency Medicine

## 2013-04-26 NOTE — Telephone Encounter (Signed)
Message left to return call to Val Farnam at [phone].    

## 2013-04-26 NOTE — Telephone Encounter (Signed)
Message copied by Michele Mcalpine on Wed Apr 26, 2013  3:44 PM ------      Message from: Elveria Rising      Created: Fri Apr 21, 2013 11:55 AM       DEXA compared with 2011 unchanged was -2.9 now -2.7, pt off boniva, ok to stay off as no change, but should continue weight bearing exercise, vit d and calcium ------

## 2013-05-01 NOTE — Telephone Encounter (Signed)
Patient returned call and message from Dr. Charlies Constable given. Will follow up prn.   Routing to provider for final review. Patient agreeable to disposition. Will close encounter

## 2013-05-04 ENCOUNTER — Telehealth: Payer: Self-pay | Admitting: Emergency Medicine

## 2013-05-04 NOTE — Telephone Encounter (Signed)
Encounter opened erroneously.   Closed encounter.   

## 2013-05-05 ENCOUNTER — Ambulatory Visit (INDEPENDENT_AMBULATORY_CARE_PROVIDER_SITE_OTHER): Payer: Federal, State, Local not specified - PPO | Admitting: Nurse Practitioner

## 2013-05-05 ENCOUNTER — Telehealth: Payer: Self-pay | Admitting: *Deleted

## 2013-05-05 ENCOUNTER — Encounter: Payer: Self-pay | Admitting: Nurse Practitioner

## 2013-05-05 VITALS — BP 122/80 | HR 96 | Ht 65.0 in | Wt 172.0 lb

## 2013-05-05 DIAGNOSIS — B373 Candidiasis of vulva and vagina: Secondary | ICD-10-CM

## 2013-05-05 DIAGNOSIS — B3731 Acute candidiasis of vulva and vagina: Secondary | ICD-10-CM

## 2013-05-05 MED ORDER — FLUCONAZOLE 150 MG PO TABS: 150.0000 mg | ORAL_TABLET | Freq: Once | ORAL | Status: DC

## 2013-05-05 NOTE — Telephone Encounter (Signed)
Patient notified.  Routed to provider for review, encounter closed.

## 2013-05-05 NOTE — Patient Instructions (Addendum)
Monilial Vaginitis  Vaginitis in a soreness, swelling and redness (inflammation) of the vagina and vulva. Monilial vaginitis is not a sexually transmitted infection.  CAUSES   Yeast vaginitis is caused by yeast (candida) that is normally found in your vagina. With a yeast infection, the candida has overgrown in number to a point that upsets the chemical balance.  SYMPTOMS   · White, thick vaginal discharge.  · Swelling, itching, redness and irritation of the vagina and possibly the lips of the vagina (vulva).  · Burning or painful urination.  · Painful intercourse.  DIAGNOSIS   Things that may contribute to monilial vaginitis are:  · Postmenopausal and virginal states.  · Pregnancy.  · Infections.  · Being tired, sick or stressed, especially if you had monilial vaginitis in the past.  · Diabetes. Good control will help lower the chance.  · Birth control pills.  · Tight fitting garments.  · Using bubble bath, feminine sprays, douches or deodorant tampons.  · Taking certain medications that kill germs (antibiotics).  · Sporadic recurrence can occur if you become ill.  TREATMENT   Your caregiver will give you medication.  · There are several kinds of anti monilial vaginal creams and suppositories specific for monilial vaginitis. For recurrent yeast infections, use a suppository or cream in the vagina 2 times a week, or as directed.  · Anti-monilial or steroid cream for the itching or irritation of the vulva may also be used. Get your caregiver's permission.  · Painting the vagina with methylene blue solution may help if the monilial cream does not work.  · Eating yogurt may help prevent monilial vaginitis.  HOME CARE INSTRUCTIONS   · Finish all medication as prescribed.  · Do not have sex until treatment is completed or after your caregiver tells you it is okay.  · Take warm sitz baths.  · Do not douche.  · Do not use tampons, especially scented ones.  · Wear cotton underwear.  · Avoid tight pants and panty  hose.  · Tell your sexual partner that you have a yeast infection. They should go to their caregiver if they have symptoms such as mild rash or itching.  · Your sexual partner should be treated as well if your infection is difficult to eliminate.  · Practice safer sex. Use condoms.  · Some vaginal medications cause latex condoms to fail. Vaginal medications that harm condoms are:  · Cleocin cream.  · Butoconazole (Femstat®).  · Terconazole (Terazol®) vaginal suppository.  · Miconazole (Monistat®) (may be purchased over the counter).  SEEK MEDICAL CARE IF:   · You have a temperature by mouth above 102° F (38.9° C).  · The infection is getting worse after 2 days of treatment.  · The infection is not getting better after 3 days of treatment.  · You develop blisters in or around your vagina.  · You develop vaginal bleeding, and it is not your menstrual period.  · You have pain when you urinate.  · You develop intestinal problems.  · You have pain with sexual intercourse.  Document Released: 10/22/2004 Document Revised: 04/06/2011 Document Reviewed: 07/06/2008  ExitCare® Patient Information ©2014 [employer].

## 2013-05-05 NOTE — Telephone Encounter (Signed)
Message copied by Alfonzo Feller on Fri May 05, 2013 12:52 PM ------      Message from: Elveria Rising      Created: Fri May 05, 2013 11:48 AM      Regarding: DEXA       pls inform her that comparing 2011 and 2013 DEXA's her bones are stable and I would continue to do the weight bearing exercise, vit d and Calcium, and estrace ------

## 2013-05-05 NOTE — Progress Notes (Signed)
Subjective:     Patient ID: Yolanda Copeland, female   DOB: 1950/04/20, 63 y.o.   MRN: [id number]  HPI   This 63 yo  WM  Fe complains of vaginitis with watery discharge.  Symptoms for 3 days. Vaginal discharge initially was thick then started with redness and itching. States she feels constantly wet.  No OTC med's. No recent antibiotics..   Last SA about 1 week ago without problem. No urinary symptoms.   Review of Systems  Constitutional: Negative for fever, chills and fatigue.  Respiratory: Negative.   Cardiovascular: Negative.   Gastrointestinal: Negative.   Genitourinary: Positive for vaginal discharge. Negative for dysuria, urgency, frequency, hematuria, flank pain and dyspareunia.  Skin: Negative.   Neurological: Negative.   Psychiatric/Behavioral: Negative.        Objective:   Physical Exam  Constitutional: She is oriented to person, place, and time. She appears well-developed and well-nourished.  Abdominal: Soft. She exhibits no distension. There is no tenderness. There is no rebound and no guarding.  No flank pain  Genitourinary:  Vaginal discharge is thick and white. no bladder tenderness or pain on bimanual.  Wet Prep: PH: 3.5; NSS:  No trich; PH: +yeast.  Neurological: She is alert and oriented to person, place, and time.  Psychiatric: She has a normal mood and affect. Her behavior is normal. Judgment and thought content normal.       Assessment:     Yeast vagiitis    Plan:     Diflucan 150 mg X 2

## 2013-05-09 NOTE — Progress Notes (Signed)
Encounter reviewed by Dr. Brook Silva.  

## 2013-07-17 ENCOUNTER — Ambulatory Visit (INDEPENDENT_AMBULATORY_CARE_PROVIDER_SITE_OTHER): Payer: Federal, State, Local not specified - PPO | Admitting: Internal Medicine

## 2013-07-17 ENCOUNTER — Encounter: Payer: Self-pay | Admitting: Internal Medicine

## 2013-07-17 VITALS — BP 164/90 | Temp 98.2°F | Ht 65.0 in | Wt 172.0 lb

## 2013-07-17 DIAGNOSIS — Z811 Family history of alcohol abuse and dependence: Secondary | ICD-10-CM | POA: Insufficient documentation

## 2013-07-17 DIAGNOSIS — Z8249 Family history of ischemic heart disease and other diseases of the circulatory system: Secondary | ICD-10-CM | POA: Insufficient documentation

## 2013-07-17 DIAGNOSIS — M810 Age-related osteoporosis without current pathological fracture: Secondary | ICD-10-CM

## 2013-07-17 DIAGNOSIS — I10 Essential (primary) hypertension: Secondary | ICD-10-CM

## 2013-07-17 DIAGNOSIS — R413 Other amnesia: Secondary | ICD-10-CM

## 2013-07-17 DIAGNOSIS — Z23 Encounter for immunization: Secondary | ICD-10-CM

## 2013-07-17 DIAGNOSIS — E785 Hyperlipidemia, unspecified: Secondary | ICD-10-CM

## 2013-07-17 DIAGNOSIS — J449 Chronic obstructive pulmonary disease, unspecified: Secondary | ICD-10-CM

## 2013-07-17 DIAGNOSIS — Z8489 Family history of other specified conditions: Secondary | ICD-10-CM

## 2013-07-17 MED ORDER — LOSARTAN POTASSIUM 50 MG PO TABS: 50.0000 mg | ORAL_TABLET | Freq: Every day | ORAL | Status: DC

## 2013-07-17 MED ORDER — ATORVASTATIN CALCIUM 20 MG PO TABS: 20.0000 mg | ORAL_TABLET | Freq: Every day | ORAL | Status: DC

## 2013-07-17 NOTE — Progress Notes (Signed)
Pre visit review using our clinic review tool, if applicable. No additional management support is needed unless otherwise documented below in the visit note.   Chief Complaint  Patient presents with  . Establish Care    Having trouble with her short term memory.    HPI: Patient comes in today for new Care visit  This patient is a 63 year old married retired white female originally from [place] having lived in the [place]  [facility] and also in [place] because of her husband's job and now settled in [place]. Her previous primary care physician was Dr.Gates.    Other providers are Dr. Toy Care, Dr. Jacelyn Grip, pulmonologist at [facility], Dr. Charlies Constable gynecologist and Dr. Marlou Starks orthopedic at [facility]  Also Dr. Wende Neighbors  GI                                                                                                                                                    HT meds for years 24 hours monitor  In past.  And then put on  Evaluation in   Cough with acei. Cozaar ok since then.  Hx treatmill  With dye.  1993.neg  Also cholesterol on treatment with Lipitor without reported side effects.   ? Short term memory an issue in past year.  ? Hard to determine when mom died  Work Field seismologist. Constant work Field seismologist. Think she could remember it easier before her psychiatrist is a little bit of concern and asked to be mentioned because she couldn't remember what her mother died specifically although it was pretty important to her.  Mom had Parkinson's and lewy body dementia.  She also has the diagnosis of osteoporosis based on what would packed fracture of her ankle. She brings a copy of some labs today but not a full record. She does no regular exercise has been on estradiol or estrogen replacement since surgical menopause in her 25s for fibroids bleeding and abnormal ovarian cysts.  She's given prednisone tapers hypersensitivity cough when needed by her pulmonologist. She doesn't need it that often maybe once a  year. She does take T3 2000 international units a day and calcium 600.  Health Maintenance  Topic Date Due  . Pap Smear  02/29/2004  . Zostavax  09/25/2012  . Influenza Vaccine  08/26/2013  . Mammogram  11/01/2014  . Colonoscopy  08/27/2018  . Tetanus/tdap  07/18/2023   Health Maintenance Review Past hx  FX\Right  ankle  slipped on ice  1992   5 years ago  Low impact  fracture left  Dr Beola Cord did surgery   Had bone density  Last used boniva   fosamax  Maybe total 2 years.  Stopped cause of caution of concern. Of se  And dr lathrop did last dexa  said no change  May need dental  implants.  Hysterectomy in 190s fibroids and abnormal paps had done in [place] .  Also took  Suspicious cysts and then took out  Ovaries  Surgical menopause  Age 24s.   Under tx for depression  proistigue seem to help .  Dr Toy Care.  Nephew died of leukemia and then dad died and daughter s fiance died days laterabout 6-7 later.   ROS:  GEN/ HEENT: No fever, significant weight changes sweats headaches vision problems hearing changes, rare migrain 1-2 x per year CV/ PULM; No chest pain shortness of breath cough, syncope,edema  change in exercise tolerance. GI /GU: No adominal pain, vomiting, change in bowel habits. No blood in the stool. No significant GU symptoms. Remote history of asymptomatic kidney stone seen on a x-ray as an incidental finding in 19r 90s SKIN/HEME: ,no acute skin rashes suspicious lesions or bleeding. No lymphadenopathy, nodules, masses.  NEURO/ PSYCH:  No neurologic signs such as weakness numbness. No  Increase depression anxiety. IMM/ Allergy: No unusual infections.  Allergy .   REST of 12 system review negative except as per HPI   Past Medical History  Diagnosis Date  . Hyperlipemia   . Hypertension   . Abnormal CXR   . COPD (chronic obstructive pulmonary disease)   . Broken ankle 12/10    atraumatic fracture   . GERD (gastroesophageal reflux disease)   . Migraine without aura    . Depression   . Chicken pox   . Allergy   . Migraines   . Colon polyps     on 5 year recalldr Gamin   . Cervicalgia   . Osteoporosis, unspecified   . Encounter for long-term (current) use of other medications   . Unspecified vitamin D deficiency   . Hx of urinary stone     renal seen as incidental finding on x ray  . Hx of varicella   . H/O amblyopia     hx patching left eye    Family History  Problem Relation Age of Onset  . Heart disease Father     died conolications GB surgery perf and sepsis  . Hypertension Father   . Alcohol abuse Father   . Hyperlipidemia Father   . Dupuytren's contracture Father   . Diabetes Maternal Grandmother   . Hyperlipidemia Mother   . Alcohol abuse Brother   . Hyperlipidemia Brother   . Hypertension Brother   . Diabetes Brother   . Dupuytren's contracture Brother   . Alcohol abuse Brother   . Hyperlipidemia Brother   . Cancer Paternal Aunt     Ovarian Cancer  . Dementia Mother     [place]ody dementia and Parkinson's    neg fam th=yroid osteoporosis but mom had postural changes neg for hip fracture  History   Social History  . Marital Status: Married    Spouse Name: N/A    Number of Children: N/A  . Years of Education: N/A   Occupational History  . Retired    Social History Main Topics  . Smoking status: Former Smoker -- 0.50 packs/day    Types: Cigarettes    Quit date: 12/27/2007  . Smokeless tobacco: Never Used  . Alcohol Use: 1.2 - 1.8 oz/week    2-3 Glasses of wine per week  . Drug Use: None  . Sexual Activity: Yes    Partners: Male    Birth Control/ Protection: Surgical     Comment: Hysterectomy   Other Topics Concern  . None   Social History Narrative  6-9 hours of sleep per night   Lives with her husband   Has 1 dog and a few fish   Retired on 06/09/13   [place] [facility] [place]    \husband worked for [employer]   She worked ciommunications   G5 G2   [place] no tob ogg for6 years onset age 85 years    Neg ets FA  pos safely zx 3           Outpatient Encounter Prescriptions as of 07/17/2013  Medication Sig  . acetaminophen (TYLENOL) 500 MG tablet Take 500 mg by mouth every 6 (six) hours as needed.    . ALPRAZolam (XANAX) 1 MG tablet as needed.  Marland Kitchen aspirin 81 MG tablet Take 81 mg by mouth daily.  Marland Kitchen atorvastatin (LIPITOR) 20 MG tablet Take 1 tablet (20 mg total) by mouth daily.  . Calcium Carbonate (CALCIUM 600 PO) Take 1 tablet by mouth daily.  . Cholecalciferol (VITAMIN D3) 2000 UNITS TABS Once daily   . Cyanocobalamin (B-12) 2500 MCG TABS Take 1 tablet by mouth daily.  Marland Kitchen estradiol (ESTRACE) 1 MG tablet Take 1 tablet (1 mg total) by mouth daily.  . famotidine (PEPCID) 10 MG tablet Take 10 mg by mouth 2 (two) times daily.  . fexofenadine (ALLEGRA) 180 MG tablet Take 180 mg by mouth daily.  . fluticasone (FLONASE) 50 MCG/ACT nasal spray Place 1 spray into both nostrils daily.  Marland Kitchen losartan (COZAAR) 50 MG tablet Take 1 tablet (50 mg total) by mouth daily.  . metroNIDAZOLE (METROGEL) 1 % gel Apply 1 application topically daily. For Rosacea  . Multiple Vitamins-Minerals (CENTRUM SILVER PO) Once daily   . Naproxen Sodium (ALEVE) 220 MG CAPS Take 2 capsules by mouth daily.  Marland Kitchen PRISTIQ 50 MG 24 hr tablet   . TRAZODONE HCL PO Take 50 mg by mouth as needed.   . [DISCONTINUED] atorvastatin (LIPITOR) 20 MG tablet Take 20 mg by mouth daily.    . [DISCONTINUED] losartan (COZAAR) 50 MG tablet Take 50 mg by mouth daily.    . [DISCONTINUED] calcium citrate-vitamin D (CITRACAL+D) 315-200 MG-UNIT per tablet Take 1 tablet by mouth daily.    . [DISCONTINUED] fluconazole (DIFLUCAN) 150 MG tablet Take 1 tablet (150 mg total) by mouth once. Take one tablet.  Repeat in 48 hours if symptoms are not completely resolved.  . [DISCONTINUED] ibandronate (BONIVA) 150 MG tablet Take 150 mg by mouth every 30 (thirty) days. Take in the morning with a full glass of water, on an empty stomach, and do not take anything else by mouth or lie  down for the next 30 min.     EXAM:  BP 164/90  Temp(Src) 98.2 F (36.8 C) (Oral)  Ht 5\' 5"  (1.651 m)  Wt 172 lb (78.019 kg)  BMI 28.62 kg/m2  Body mass index is 28.62 kg/(m^2).  Physical Exam: Vital signs reviewed UYQ:IHKV is a well-developed well-nourished alert cooperative    who appearsr stated age in no acute distress.  HEENT: normocephalic atraumatic , Eyes: PERRL EOM's full, conjunctiva clear, Nares: paten,t no deformity discharge or tenderness., Ears: no deformity EAC's clear TMs with normal landmarks. Mouth: clear OP, no lesions, edema.  Moist mucous membranes. Dentition in adequate repair. NECK: supple without masses, thyromegaly or bruits. CHEST/PULM:  Clear to auscultation and percussion breath sounds equal no wheeze , rales or rhonchi.CV: PMI is nondisplaced, S1 S2 no gallops, murmurs, rubs. Peripheral pulses are full without delay.No JVD .  ABDOMEN: Bowel sounds normal nontender  No guard or rebound, no hepato splenomegal no CVA tenderness.   Extremtities:  No clubbing cyanosis or edema, no acute joint swelling or redness no focal atrophy NEURO:  Oriented x3, cranial nerves 3-12 appear to be intact, no obvious focal weakness,gait within normal limits no abnormal reflexes or asymmetrical SKIN: No acute rashes normal turgor, color, no bruising or petechiae. PSYCH: Oriented, good eye contact, no obvious depression anxiety, cognition and judgment appear normal. LN: no cervical adenopathy  Lab Results  Component Value Date   WBC 15.3* 04/14/2010   HGB 12.9 04/14/2010   HCT 37.7 04/14/2010   PLT 469.0* 04/14/2010   GLUCOSE 138* 04/14/2010   NA 134* 04/14/2010   K 4.1 04/14/2010   CL 98 04/14/2010   CREATININE 0.6 04/14/2010   BUN 8 04/14/2010   CO2 26 04/14/2010   Labs from previous pcp to review ASSESSMENT AND PLAN:  Discussed the following assessment and plan:  Osteoporosis, unspecified  COPD UNSPECIFIED  Memory difficulties - mom had lewy body dem check b12  consider  neuro assessment. other causes possible  HYPERLIPIDEMIA  HYPERTENSION  Need for vaccination with 13-polyvalent pneumococcal conjugate vaccine - Plan: Pneumococcal conjugate vaccine 13-valent IM  Need for Tdap vaccination - Plan: Tdap vaccine greater than or equal to 7yo IM  Fam hx-ischem heart disease  Family history of alcohol abuse and dependence Last full labs appear to have been done in October 2014  We can get full set of labs early with pth pl b12 in the interim.  Patient Care Team: Burnis Medin, MD as PCP - General (Internal Medicine) Azalia Bilis, MD as Consulting Physician (Obstetrics and Gynecology) Belva Chimes, MD as Referring Physician (Orthopedic Surgery) Massie Bougie, MD as Referring Physician Rupinder Charm Rings, MD as Consulting Physician (Psychiatry) Patient Instructions  Check BP readings  At home to ensure in range  Below 140/90 will reviewed record n about next step.  May consider other lab test and consult about the osteoporosis rx  There are other options.  Also such as Prolia . Get records from evaluation at [facility] pulmonary.  May  Do a referral to neurology if indicated about the memory concern.  As we discussed .  aerobic exercise is good for limiting cognitive decline no matter what cause. pneumococcal vaccine today. Plan cpx in 4 months or as needed in meantime    Wanda K. Panosh M.D.

## 2013-07-17 NOTE — Patient Instructions (Signed)
Check BP readings  At home to ensure in range  Below 140/90 will reviewed record n about next step.  May consider other lab test and consult about the osteoporosis rx  There are other options.  Also such as Prolia . Get records from evaluation at [facility] pulmonary.  May  Do a referral to neurology if indicated about the memory concern.  As we discussed .  aerobic exercise is good for limiting cognitive decline no matter what cause. pneumococcal vaccine today. Plan cpx in 4 months or as needed in meantime

## 2013-07-18 ENCOUNTER — Telehealth: Payer: Self-pay | Admitting: Internal Medicine

## 2013-07-18 NOTE — Telephone Encounter (Signed)
Relevant patient education mailed to patient.  

## 2013-07-23 ENCOUNTER — Encounter: Payer: Self-pay | Admitting: Internal Medicine

## 2013-07-23 DIAGNOSIS — R413 Other amnesia: Secondary | ICD-10-CM | POA: Insufficient documentation

## 2013-07-23 NOTE — Assessment & Plan Note (Signed)
Plan check pth   Pl with labs.  Intervention appropriate  .

## 2013-07-26 ENCOUNTER — Telehealth: Payer: Self-pay | Admitting: Family Medicine

## 2013-07-26 NOTE — Telephone Encounter (Signed)
Message copied by Hulda Humphrey on Wed Jul 26, 2013  1:02 PM ------      Message from: [facility]      Created: Sun Jul 23, 2013  5:24 PM      Regarding: arrange lab appt when convenient        M             tell patient i reviewed her labs         I Put in future labs orders  that covered memory and osteoporosis and cpx  issues  But forgot the vit d level.             Have her make fasting lab appt for her convenience this summer.      Please  add Vit d level Dx.  Osteoporosis ?( make sure lab sees all the labs orders)            We can do a neurology  referral also  Or we can wait and discuss at there cpx  regarding her memory concerns.       Thanks      WP ------

## 2013-07-27 ENCOUNTER — Other Ambulatory Visit: Payer: Self-pay | Admitting: Family Medicine

## 2013-07-27 DIAGNOSIS — M810 Age-related osteoporosis without current pathological fracture: Secondary | ICD-10-CM

## 2013-07-27 NOTE — Telephone Encounter (Signed)
Spoke to the pt.  Made her future lab appt for 08/25/13 @ 8:45.  Placed order for future vitamin d.  She will wait and discuss a neuro referral when she sees Dr. Regis Bill in Sept.  Will call back sooner if needed.

## 2013-08-06 ENCOUNTER — Encounter: Payer: Self-pay | Admitting: Internal Medicine

## 2013-08-20 ENCOUNTER — Encounter: Payer: Self-pay | Admitting: Internal Medicine

## 2013-08-20 DIAGNOSIS — M25511 Pain in right shoulder: Secondary | ICD-10-CM | POA: Insufficient documentation

## 2013-08-25 ENCOUNTER — Other Ambulatory Visit (INDEPENDENT_AMBULATORY_CARE_PROVIDER_SITE_OTHER): Payer: Federal, State, Local not specified - PPO

## 2013-08-25 DIAGNOSIS — R413 Other amnesia: Secondary | ICD-10-CM

## 2013-08-25 DIAGNOSIS — I10 Essential (primary) hypertension: Secondary | ICD-10-CM

## 2013-08-25 DIAGNOSIS — Z8249 Family history of ischemic heart disease and other diseases of the circulatory system: Secondary | ICD-10-CM

## 2013-08-25 DIAGNOSIS — M810 Age-related osteoporosis without current pathological fracture: Secondary | ICD-10-CM

## 2013-08-25 DIAGNOSIS — J449 Chronic obstructive pulmonary disease, unspecified: Secondary | ICD-10-CM

## 2013-08-25 DIAGNOSIS — E785 Hyperlipidemia, unspecified: Secondary | ICD-10-CM

## 2013-08-25 LAB — LIPID PANEL
Cholesterol: 201 mg/dL — ABNORMAL HIGH (ref 0–200)
HDL: 62.1 mg/dL (ref >39.00)
LDL CALC: 107 mg/dL — AB (ref 0–99)
NONHDL: 138.9
Total CHOL/HDL Ratio: 3
Triglycerides: 159 mg/dL — ABNORMAL HIGH (ref 0.0–149.0)
VLDL: 31.8 mg/dL (ref 0.0–40.0)

## 2013-08-25 LAB — CBC WITH DIFFERENTIAL/PLATELET
BASOS ABS: 0 10*3/uL (ref 0.0–0.1)
Basophils Relative: 0.4 % (ref 0.0–3.0)
EOS ABS: 0.1 10*3/uL (ref 0.0–0.7)
Eosinophils Relative: 1.5 % (ref 0.0–5.0)
HCT: 42.9 % (ref 36.0–46.0)
HEMOGLOBIN: 14.5 g/dL (ref 12.0–15.0)
LYMPHS PCT: 32 % (ref 12.0–46.0)
Lymphs Abs: 1.7 10*3/uL (ref 0.7–4.0)
MCHC: 33.7 g/dL (ref 30.0–36.0)
MCV: 88.2 fl (ref 78.0–100.0)
MONOS PCT: 8.3 % (ref 3.0–12.0)
Monocytes Absolute: 0.5 10*3/uL (ref 0.1–1.0)
NEUTROS PCT: 57.8 % (ref 43.0–77.0)
Neutro Abs: 3.2 10*3/uL (ref 1.4–7.7)
PLATELETS: 274 10*3/uL (ref 150.0–400.0)
RBC: 4.86 Mil/uL (ref 3.87–5.11)
RDW: 12.7 % (ref 11.5–15.5)
WBC: 5.5 10*3/uL (ref 4.0–10.5)

## 2013-08-25 LAB — BASIC METABOLIC PANEL
BUN: 12 mg/dL (ref 6–23)
CALCIUM: 9.7 mg/dL (ref 8.4–10.5)
CO2: 32 mEq/L (ref 19–32)
CREATININE: 0.8 mg/dL (ref 0.4–1.2)
Chloride: 103 mEq/L (ref 96–112)
GFR: 83.52 mL/min (ref >60.00)
GLUCOSE: 120 mg/dL — AB (ref 70–99)
Potassium: 4.1 mEq/L (ref 3.5–5.1)
SODIUM: 140 meq/L (ref 135–145)

## 2013-08-25 LAB — TSH: TSH: 1.53 u[IU]/mL (ref 0.35–4.50)

## 2013-08-25 LAB — HEPATIC FUNCTION PANEL
ALK PHOS: 83 U/L (ref 39–117)
ALT: 21 U/L (ref 0–35)
AST: 23 U/L (ref 0–37)
Albumin: 4.1 g/dL (ref 3.5–5.2)
BILIRUBIN DIRECT: 0.1 mg/dL (ref 0.0–0.3)
BILIRUBIN TOTAL: 0.8 mg/dL (ref 0.2–1.2)
Total Protein: 7.1 g/dL (ref 6.0–8.3)

## 2013-08-25 LAB — VITAMIN D 25 HYDROXY (VIT D DEFICIENCY, FRACTURES): VITD: 44.58 ng/mL (ref 30.00–100.00)

## 2013-08-25 LAB — VITAMIN B12: Vitamin B-12: 1477 pg/mL — ABNORMAL HIGH (ref 211–911)

## 2013-08-25 LAB — T4, FREE: Free T4: 0.85 ng/dL (ref 0.60–1.60)

## 2013-08-26 LAB — PROLACTIN: Prolactin: 6.4 ng/mL

## 2013-08-28 LAB — PTH, INTACT AND CALCIUM
Calcium: 9.6 mg/dL (ref 8.4–10.5)
PTH: 23.5 pg/mL (ref 14.0–72.0)

## 2013-09-15 HISTORY — PX: SHOULDER SURGERY: SHX246

## 2013-09-19 ENCOUNTER — Encounter: Payer: Self-pay | Admitting: Gynecology

## 2013-10-23 ENCOUNTER — Ambulatory Visit (INDEPENDENT_AMBULATORY_CARE_PROVIDER_SITE_OTHER): Payer: Federal, State, Local not specified - PPO | Admitting: Family Medicine

## 2013-10-23 DIAGNOSIS — Z23 Encounter for immunization: Secondary | ICD-10-CM

## 2013-10-24 ENCOUNTER — Encounter: Payer: Self-pay | Admitting: Internal Medicine

## 2013-10-24 ENCOUNTER — Ambulatory Visit (INDEPENDENT_AMBULATORY_CARE_PROVIDER_SITE_OTHER): Payer: Federal, State, Local not specified - PPO | Admitting: Internal Medicine

## 2013-10-24 VITALS — BP 134/94 | Temp 97.8°F | Ht 64.5 in | Wt 162.6 lb

## 2013-10-24 DIAGNOSIS — R739 Hyperglycemia, unspecified: Secondary | ICD-10-CM

## 2013-10-24 DIAGNOSIS — M810 Age-related osteoporosis without current pathological fracture: Secondary | ICD-10-CM

## 2013-10-24 DIAGNOSIS — Z87898 Personal history of other specified conditions: Secondary | ICD-10-CM

## 2013-10-24 DIAGNOSIS — E785 Hyperlipidemia, unspecified: Secondary | ICD-10-CM

## 2013-10-24 DIAGNOSIS — R7309 Other abnormal glucose: Secondary | ICD-10-CM

## 2013-10-24 DIAGNOSIS — I10 Essential (primary) hypertension: Secondary | ICD-10-CM

## 2013-10-24 DIAGNOSIS — Z0000 Encounter for general adult medical examination without abnormal findings: Secondary | ICD-10-CM

## 2013-10-24 DIAGNOSIS — Z8249 Family history of ischemic heart disease and other diseases of the circulatory system: Secondary | ICD-10-CM

## 2013-10-24 LAB — HEMOGLOBIN A1C: HEMOGLOBIN A1C: 5.7 % (ref 4.6–6.5)

## 2013-10-24 MED ORDER — SCOPOLAMINE 1 MG/3DAYS TD PT72: 1.0000 | MEDICATED_PATCH | TRANSDERMAL | Status: DC

## 2013-10-24 NOTE — Patient Instructions (Signed)
Use flonase every day . To see if helps flares.    Prednisone can cause osteoporosis . Avoid sugars sweet and simplt carbs.   Add on exercise . Consideration of adding metformin  To prevent diabetes and control sugar.  lifestyle more effective   Healthy lifestyle includes : At least 150 minutes of exercise weeks  , weight at healthy levels, which is usually   BMI 19-25. Avoid trans fats and processed foods;  Increase fresh fruits and veges to 5 servings per day. And avoid sweet beverages including tea and juice. Mediterranean diet with olive oil and nuts have been noted to be heart and brain healthy . Avoid tobacco products . Limit  alcohol to  7 per week for women and 14 servings for men.  Get adequate sleep . Wear seat belts . Don't text and drive .

## 2013-10-24 NOTE — Progress Notes (Signed)
Pre visit review using our clinic review tool, if applicable. No additional management support is needed unless otherwise documented below in the visit note.   Chief Complaint  Patient presents with  . Annual Exam    HPI: Patient comes in today for Preventive Health Care visit   EHR server was done at time o visit so reconstructed note  Had shoulder surgery aug 21 bone spur at [place] doing better  Having congestion allergy sx  Possible  On pepcid for gerd and cough  See old records from [place] with recurrent pred use for atypical PNA and  Poss allergic phenom Going on trip fishing asks for sea sickness patch   Health Maintenance  Topic Date Due  . Zostavax  09/25/2012  . Influenza Vaccine  08/27/2014  . Mammogram  11/01/2014  . Pap Smear  04/04/2016  . Colonoscopy  09/22/2018  . Tetanus/tdap  07/18/2023   Health Maintenance Review LIFESTYLE:  Exercise:  Limited a bit  with surgery  Tobacco/ETS:no Alcohol: per day  Sugar beverages:no Drug use: no Colonoscopy: 2010   ROS:  GEN/ HEENT: No fever, significant weight changes sweats headaches vision problems hearing changes, CV/ PULM; No chest pain shortness of breath cough, syncope,edema  change in exercise tolerance. GI /GU: No adominal pain, vomiting, change in bowel habits. No blood in the stool. No significant GU symptoms. SKIN/HEME: ,no acute skin rashes suspicious lesions or bleeding. No lymphadenopathy, nodules, masses.  NEURO/ PSYCH:  No neurologic signs such as weakness numbness. No depression anxiety. IMM/ Allergy: No unusual infections.  Allergy .  Hx allery shots when younger  REST of 12 system review negative except as per HPI   Past Medical History  Diagnosis Date  . Hyperlipemia   . Hypertension   . Abnormal CXR   . COPD (chronic obstructive pulmonary disease)   . Broken ankle 12/10    atraumatic fracture   . GERD (gastroesophageal reflux disease)   . Migraine without aura   . Depression   . Chicken pox    . Allergy   . Migraines   . Colon polyps     on 5 year recalldr Gamin   . Cervicalgia   . Osteoporosis, unspecified   . Encounter for long-term (current) use of other medications   . Unspecified vitamin D deficiency   . Hx of urinary stone     renal seen as incidental finding on x ray  . Hx of varicella   . H/O amblyopia     hx patching left eye  Bro DM type 2   Family History  Problem Relation Age of Onset  . Heart disease Father     died conolications GB surgery perf and sepsis  . Hypertension Father   . Alcohol abuse Father   . Hyperlipidemia Father   . Dupuytren's contracture Father   . Diabetes Maternal Grandmother   . Hyperlipidemia Mother   . Alcohol abuse Brother   . Hyperlipidemia Brother   . Hypertension Brother   . Diabetes Brother   . Dupuytren's contracture Brother   . Alcohol abuse Brother   . Hyperlipidemia Brother   . Cancer Paternal Aunt     Ovarian Cancer  . Dementia Mother     Peabody dementia and Parkinson's    History   Social History  . Marital Status: Married    Spouse Name: N/A    Number of Children: N/A  . Years of Education: N/A   Occupational History  . Retired  Social History Main Topics  . Smoking status: Former Smoker -- 0.50 packs/day    Types: Cigarettes    Quit date: 12/27/2007  . Smokeless tobacco: Never Used  . Alcohol Use: 1.2 - 1.8 oz/week    2-3 Glasses of wine per week  . Drug Use: None  . Sexual Activity: Yes    Partners: Male    Birth Control/ Protection: Surgical     Comment: Hysterectomy   Other Topics Concern  . None   Social History Narrative   6-9 hours of sleep per night   Lives with her husband   Has 1 dog and a few fish   Retired on 06/09/13   [place] [facility] [place]    \husband worked for [employer]   She worked ciommunications   G5 G2   [place] no tob ogg for6 years onset age 30 years    Neg ets FA pos safely zx 3           Outpatient Encounter Prescriptions as of 10/24/2013  Medication Sig   . acetaminophen (TYLENOL) 500 MG tablet Take 500 mg by mouth every 6 (six) hours as needed.    . ALPRAZolam (XANAX) 1 MG tablet as needed.  Marland Kitchen aspirin 81 MG tablet Take 81 mg by mouth daily.  Marland Kitchen atorvastatin (LIPITOR) 20 MG tablet Take 1 tablet (20 mg total) by mouth daily.  . Calcium Carbonate (CALCIUM 600 PO) Take 1 tablet by mouth daily.  . Cholecalciferol (VITAMIN D3) 2000 UNITS TABS Once daily   . estradiol (ESTRACE) 1 MG tablet Take 1 tablet (1 mg total) by mouth daily.  . famotidine (PEPCID) 10 MG tablet Take 10 mg by mouth 2 (two) times daily.  . fexofenadine (ALLEGRA) 180 MG tablet Take 180 mg by mouth daily.  . fluticasone (FLONASE) 50 MCG/ACT nasal spray Place 1 spray into both nostrils daily.  Marland Kitchen losartan (COZAAR) 50 MG tablet Take 1 tablet (50 mg total) by mouth daily.  . metroNIDAZOLE (METROGEL) 1 % gel Apply 1 application topically daily. For Rosacea  . Multiple Vitamins-Minerals (CENTRUM SILVER PO) Once daily   . Naproxen Sodium (ALEVE) 220 MG CAPS Take 2 capsules by mouth daily.  Marland Kitchen PRISTIQ 50 MG 24 hr tablet   . TRAZODONE HCL PO Take 50 mg by mouth as needed.   Marland Kitchen scopolamine (TRANSDERM-SCOP) 1 MG/3DAYS Place 1 patch (1.5 mg total) onto the skin every 3 (three) days.  . [DISCONTINUED] Cyanocobalamin (B-12) 2500 MCG TABS Take 1 tablet by mouth daily.    EXAM:  BP 134/94  Temp(Src) 97.8 F (36.6 C) (Oral)  Ht 5' 4.5" (1.638 m)  Wt 162 lb 9.6 oz (73.755 kg)  BMI 27.49 kg/m2  Body mass index is 27.49 kg/(m^2).  Physical Exam: Vital signs reviewed SEG:BTDV is a well-developed well-nourished alert cooperative    who appearsr stated age in no acute distress.  HEENT: normocephalic atraumatic , Eyes: PERRL EOM's full, conjunctiva clear, Nares: paten,t no deformity discharge or tenderness., Ears: no deformity EAC's clear TMs with normal landmarks. Mouth: clear OP, no lesions, edema.  Moist mucous membranes. Dentition in adequate repair. NECK: supple without masses, thyromegaly  or bruits. CHEST/PULM:  Clear to auscultation and percussion breath sounds equal no wheeze , rales or rhonchi. No chest wall deformities or tenderness. CV: PMI is nondisplaced, S1 S2 no gallops, murmurs, rubs. Peripheral pulses are full without delay.No JVD .  Breast: normal by inspection . No dimpling, discharge, masses, tenderness or discharge . ABDOMEN: Bowel sounds  normal nontender  No guard or rebound, no hepato splenomegal no CVA tenderness.  No hernia. Extremtities:  No clubbing cyanosis or edema, no acute joint swelling or redness no focal atrophy NEURO:  Oriented x3, cranial nerves 3-12 appear to be intact, no obvious focal weakness,gait within normal limits no abnormal reflexes or asymmetrical SKIN: No acute rashes normal turgor, color, no bruising or petechiae. PSYCH: Oriented, good eye contact, no obvious depression anxiety, cognition and judgment appear normal. LN: no cervical axillary inguinal adenopathy  Lab Results  Component Value Date   WBC 5.5 08/25/2013   HGB 14.5 08/25/2013   HCT 42.9 08/25/2013   PLT 274.0 08/25/2013   GLUCOSE 120* 08/25/2013   CHOL 201* 08/25/2013   TRIG 159.0* 08/25/2013   HDL 62.10 08/25/2013   LDLCALC 107* 08/25/2013   ALT 21 08/25/2013   AST 23 08/25/2013   NA 140 08/25/2013   K 4.1 08/25/2013   CL 103 08/25/2013   CREATININE 0.8 08/25/2013   BUN 12 08/25/2013   CO2 32 08/25/2013   TSH 1.53 08/25/2013   HGBA1C 5.7 10/24/2013   BP Readings from Last 3 Encounters:  10/24/13 134/94  07/17/13 164/90  05/05/13 122/80     ASSESSMENT AND PLAN:  Discussed the following assessment and plan:  Visit for preventive health examination  Hyperglycemia - at risk  - Plan: Hemoglobin A1c  Other and unspecified hyperlipidemia - reill med  Unspecified essential hypertension - up today reported controlled   Osteoporosis, unspecified - se of some bisphosphonates   Hyperlipemia  Osteoporosis  Essential hypertension - says ok  needs fu documetation  Fam  hx-ischem heart disease  History of motion sickness - ok to use patch  Reviewed labs  Disc poss metformin  Lipid lsi close fu  Hx o recurrent steroid uses    Dis effects on bone and BG  Pt aware  Patient Care Team: Burnis Medin, MD as PCP - General (Internal Medicine) Azalia Bilis, MD as Consulting Physician (Obstetrics and Gynecology) Belva Chimes, MD as Referring Physician (Orthopedic Surgery) Massie Bougie, MD as Referring Physician Rupinder Charm Rings, MD as Consulting Physician (Psychiatry) Patient Instructions  Use flonase every day . To see if helps flares.    Prednisone can cause osteoporosis . Avoid sugars sweet and simplt carbs.   Add on exercise . Consideration of adding metformin  To prevent diabetes and control sugar.  lifestyle more effective   Healthy lifestyle includes : At least 150 minutes of exercise weeks  , weight at healthy levels, which is usually   BMI 19-25. Avoid trans fats and processed foods;  Increase fresh fruits and veges to 5 servings per day. And avoid sweet beverages including tea and juice. Mediterranean diet with olive oil and nuts have been noted to be heart and brain healthy . Avoid tobacco products . Limit  alcohol to  7 per week for women and 14 servings for men.  Get adequate sleep . Wear seat belts . Don't text and drive .         Standley Brooking. Panosh M.D.   Domingo Madeira was done  at time of visit   Missing data and not complete  Documentation.  Read notes

## 2013-10-25 DIAGNOSIS — R739 Hyperglycemia, unspecified: Secondary | ICD-10-CM | POA: Insufficient documentation

## 2013-11-10 ENCOUNTER — Other Ambulatory Visit: Payer: Self-pay

## 2013-11-27 ENCOUNTER — Encounter: Payer: Self-pay | Admitting: Internal Medicine

## 2013-11-29 ENCOUNTER — Ambulatory Visit (INDEPENDENT_AMBULATORY_CARE_PROVIDER_SITE_OTHER): Payer: Federal, State, Local not specified - PPO | Admitting: Certified Nurse Midwife

## 2013-11-29 ENCOUNTER — Encounter: Payer: Self-pay | Admitting: Certified Nurse Midwife

## 2013-11-29 VITALS — BP 110/64 | HR 68 | Resp 16 | Ht 65.0 in | Wt 165.0 lb

## 2013-11-29 DIAGNOSIS — B3731 Acute candidiasis of vulva and vagina: Secondary | ICD-10-CM

## 2013-11-29 DIAGNOSIS — B373 Candidiasis of vulva and vagina: Secondary | ICD-10-CM

## 2013-11-29 MED ORDER — NYSTATIN-TRIAMCINOLONE 100000-0.1 UNIT/GM-% EX CREA: 1.0000 "application " | TOPICAL_CREAM | Freq: Two times a day (BID) | CUTANEOUS | Status: DC

## 2013-11-29 MED ORDER — FLUCONAZOLE 150 MG PO TABS: ORAL_TABLET | ORAL | Status: DC

## 2013-11-29 NOTE — Patient Instructions (Signed)

## 2013-11-29 NOTE — Progress Notes (Signed)
63 y.o. married [id number] here with complaint of vaginal symptoms of itching, burning, and increase discharge.Patient has been living in [place] for the past month and in swimsuits for the majority of the time.  Describes discharge as scant white and minimal odor. Describes external itching and soreness.. Onset of symptoms 2 weeks ago. Denies new personal products or vaginal dryness. Used Monistat 2 weeks ago with some relief, but not resolved. No STD concerns. Urinary symptoms none .   O:Healthy female WDWN Affect: normal, orientation x 3  Exam: Abdomen:non tender Lymph node: no enlargement or tenderness Pelvic exam: External genital: red, scaling noted with exudate, no lesions, wet prep taken BUS: negative Vagina: white slightly thin non odorous discharge noted. Ph:4.5   ,Wet prep taken Cervix: normal, non tender Uterus: normal, non tender Adnexa:normal, non tender, no masses or fullness noted   Wet Prep results:positive yeast  A:Yeast vaginitis/vulvtitis   P:Discussed findings of yeast vaginitis/vulvitis and etiology. Discussed Aveeno or baking soda sitz bath for comfort. Avoid moist clothes or pads for extended period of time. If working out in gym clothes or swim suits for long periods of time change underwear or bottoms of swimsuit if possible. Olive Oil or vaseline(external use only) use for skin protection prior to activity can be used to external skin. Rx: Diflucan see order Rx Mycolog cream see order  Rv prn

## 2013-12-05 NOTE — Progress Notes (Signed)
Reviewed personally.  M. Suzanne Luciano Cinquemani, MD.  

## 2014-01-14 ENCOUNTER — Other Ambulatory Visit: Payer: Self-pay | Admitting: Internal Medicine

## 2014-01-15 HISTORY — PX: AUGMENTATION MAMMAPLASTY: SUR837

## 2014-01-15 NOTE — Telephone Encounter (Signed)
Sent to the pharmacy by e-scribe.  Pt has upcoming follow up on 01/30/14

## 2014-01-30 ENCOUNTER — Encounter: Payer: Self-pay | Admitting: Internal Medicine

## 2014-01-30 ENCOUNTER — Ambulatory Visit (INDEPENDENT_AMBULATORY_CARE_PROVIDER_SITE_OTHER): Payer: Federal, State, Local not specified - PPO | Admitting: Internal Medicine

## 2014-01-30 VITALS — BP 128/80 | Temp 98.2°F | Ht 65.0 in | Wt 165.6 lb

## 2014-01-30 DIAGNOSIS — R7301 Impaired fasting glucose: Secondary | ICD-10-CM

## 2014-01-30 DIAGNOSIS — G479 Sleep disorder, unspecified: Secondary | ICD-10-CM

## 2014-01-30 NOTE — Patient Instructions (Addendum)
Stop the middle of night snacks and choose carefully  To avoid   Sugar issues and night wakenings affravated . Exercise may help sleep.   Wellness visit in Summer or as needed.   Wt Readings from Last 3 Encounters:  01/30/14 165 lb 9.6 oz (75.116 kg)  11/29/13 165 lb (74.844 kg)  10/24/13 162 lb 9.6 oz (73.755 kg)

## 2014-01-30 NOTE — Progress Notes (Signed)
Pre visit review using our clinic review tool, if applicable. No additional management support is needed unless otherwise documented below in the visit note.  Chief Complaint  Patient presents with  . Follow-up    HPI: Yolanda Copeland 65 y.o. fu of hyperglyucemia  Pre diabetic readings   She wanted to try lsi w/o meds . Tends to snack in  middle of night and does cookies and milk. Last night about 11 sometimes snacks in night when wakens .  Goes to sleep with TV  on.  ROS: See pertinent positives and negatives per HPI. No cp sob  Dec libido not felt from depression.  Past Medical History  Diagnosis Date  . Hyperlipemia   . Hypertension   . Abnormal CXR   . COPD (chronic obstructive pulmonary disease)   . Broken ankle 12/10    atraumatic fracture   . GERD (gastroesophageal reflux disease)   . Migraine without aura   . Depression   . Chicken pox   . Allergy   . Migraines   . Colon polyps     on 5 year recalldr Gamin   . Cervicalgia   . Osteoporosis, unspecified   . Encounter for long-term (current) use of other medications   . Unspecified vitamin D deficiency   . Hx of urinary stone     renal seen as incidental finding on x ray  . Hx of varicella   . H/O amblyopia     hx patching left eye    Family History  Problem Relation Age of Onset  . Heart disease Father     died conolications GB surgery perf and sepsis  . Hypertension Father   . Alcohol abuse Father   . Hyperlipidemia Father   . Dupuytren's contracture Father   . Diabetes Maternal Grandmother   . Hyperlipidemia Mother   . Alcohol abuse Brother   . Hyperlipidemia Brother   . Hypertension Brother   . Diabetes Brother   . Dupuytren's contracture Brother   . Alcohol abuse Brother   . Hyperlipidemia Brother   . Cancer Paternal Aunt     Ovarian Cancer  . Dementia Mother     Peabody dementia and Parkinson's    History   Social History  . Marital Status: Married    Spouse Name: N/A    Number of  Children: N/A  . Years of Education: N/A   Occupational History  . Retired    Social History Main Topics  . Smoking status: Former Smoker -- 0.50 packs/day    Types: Cigarettes    Quit date: 12/27/2007  . Smokeless tobacco: Never Used  . Alcohol Use: 1.2 - 1.8 oz/week    2-3 Glasses of wine per week  . Drug Use: No  . Sexual Activity:    Partners: Male    Birth Control/ Protection: Surgical     Comment: Hysterectomy   Other Topics Concern  . None   Social History Narrative   6-9 hours of sleep per night   Lives with her husband   Has 1 dog and a few fish   Retired on 06/09/13   [place] [facility] [place]    \husband worked for [employer]   She worked ciommunications   G5 G2   [place] no tob ogg for6 years onset age 53 years    Neg ets Amaya pos safely zx 3           Outpatient Encounter Prescriptions as of 01/30/2014  Medication Sig  .  ALPRAZolam (XANAX) 1 MG tablet as needed.  Marland Kitchen aspirin 81 MG tablet Take 81 mg by mouth daily.  Marland Kitchen atorvastatin (LIPITOR) 20 MG tablet Take 1 tablet (20 mg total) by mouth daily.  . Calcium Carbonate (CALCIUM 600 PO) Take 1 tablet by mouth daily.  . Cholecalciferol (VITAMIN D3) 2000 UNITS TABS Once daily   . estradiol (ESTRACE) 1 MG tablet Take 1 tablet (1 mg total) by mouth daily.  . famotidine (PEPCID) 10 MG tablet Take 10 mg by mouth 2 (two) times daily.  . fexofenadine (ALLEGRA) 180 MG tablet Take 180 mg by mouth daily.  . fluticasone (FLONASE) 50 MCG/ACT nasal spray Place 1 spray into both nostrils daily.  Marland Kitchen losartan (COZAAR) 50 MG tablet TAKE 1 TABLET (50 MG TOTAL) BY MOUTH DAILY.  . meloxicam (MOBIC) 15 MG tablet daily.  . Multiple Vitamins-Minerals (CENTRUM SILVER PO) Once daily   . Naproxen Sodium (ALEVE) 220 MG CAPS Take 2 capsules by mouth daily.  Marland Kitchen nystatin-triamcinolone (MYCOLOG II) cream Apply 1 application topically 2 (two) times daily. Apply to affected area BID for up to 7 days.  Marland Kitchen PRISTIQ 50 MG 24 hr tablet   . TRAZODONE HCL PO Take  50 mg by mouth as needed.   Marland Kitchen acetaminophen (TYLENOL) 500 MG tablet Take 500 mg by mouth every 6 (six) hours as needed.    . [DISCONTINUED] fluconazole (DIFLUCAN) 150 MG tablet Take one tablet.  Repeat one tablet in 5 days    EXAM:  BP 128/80 mmHg  Temp(Src) 98.2 F (36.8 C) (Oral)  Ht 5\' 5"  (1.651 m)  Wt 165 lb 9.6 oz (75.116 kg)  BMI 27.56 kg/m2  LMP 01/27/1995  Body mass index is 27.56 kg/(m^2).  GENERAL: vitals reviewed and listed above, alert, oriented, appears well hydrated and in no acute distress HEENT: atraumatic, conjunctiva  clear, no obvious abnormalities on inspection of external nose and ears OP : no lesion edema or exudate  NECK: no obvious masses on inspection palpation  LUNGS: clear to auscultation bilaterally, no wheezes, rales or rhonchi, good air movement CV: HRRR, no clubbing cyanosis or  peripheral edema nl cap refill  MS: moves all extremities without noticeable focal  abnormality PSYCH: pleasant and cooperative, no obvious depression or anxiety Lab Results  Component Value Date   WBC 5.5 08/25/2013   HGB 14.5 08/25/2013   HCT 42.9 08/25/2013   PLT 274.0 08/25/2013   GLUCOSE 120* 08/25/2013   CHOL 201* 08/25/2013   TRIG 159.0* 08/25/2013   HDL 62.10 08/25/2013   LDLCALC 107* 08/25/2013   ALT 21 08/25/2013   AST 23 08/25/2013   NA 140 08/25/2013   K 4.1 08/25/2013   CL 103 08/25/2013   CREATININE 0.8 08/25/2013   BUN 12 08/25/2013   CO2 32 08/25/2013   TSH 1.53 08/25/2013   HGBA1C 5.7 10/24/2013   Lab Results  Component Value Date   HGBA1C 5.7 10/24/2013   Lab Results  Component Value Date   LDLCALC 107* 08/25/2013   CREATININE 0.8 08/25/2013    BP Readings from Last 3 Encounters:  01/30/14 128/80  11/29/13 110/64  10/24/13 134/94    ASSESSMENT AND PLAN:  Discussed the following assessment and plan:  Fasting hyperglycemia - a1c adequate  end of september   Sleep disorder - disc avoid late snacking better choices traingin inc  exercise . Exercise avoid lat night snack oreos etc  Shingles vaccine after checking with insurance coverage  -Patient advised to return or notify health care  team  if symptoms worsen ,persist or new concerns arise.  Patient Instructions   Stop the middle of night snacks and choose carefully  To avoid   Sugar issues and night wakenings affravated . Exercise may help sleep.   Wellness visit in Summer or as needed.   Wt Readings from Last 3 Encounters:  01/30/14 165 lb 9.6 oz (75.116 kg)  11/29/13 165 lb (74.844 kg)  10/24/13 162 lb 9.6 oz (73.755 kg)      Antrice Pal K. Lisa-Marie Rueger M.D.

## 2014-04-06 ENCOUNTER — Ambulatory Visit: Payer: Federal, State, Local not specified - PPO | Admitting: Gynecology

## 2014-04-16 ENCOUNTER — Other Ambulatory Visit: Payer: Self-pay | Admitting: *Deleted

## 2014-04-16 DIAGNOSIS — N951 Menopausal and female climacteric states: Secondary | ICD-10-CM

## 2014-04-16 DIAGNOSIS — M810 Age-related osteoporosis without current pathological fracture: Secondary | ICD-10-CM

## 2014-04-16 NOTE — Telephone Encounter (Signed)
No refills until mammogram - past due

## 2014-04-16 NOTE — Telephone Encounter (Signed)
Faxed Medication refill request from [facility] for Estradiol 1 mg Last AEX:  04/04/13 with TL Next AEX: 06/15/14 with PG Last MMG (if hormonal medication request): 10/31/12 Breast Density Category B; Bi-Rads 1: Negative  Refill authorized: #90/0 rfs, please advise.

## 2014-04-17 ENCOUNTER — Other Ambulatory Visit: Payer: Self-pay | Admitting: Nurse Practitioner

## 2014-04-17 ENCOUNTER — Other Ambulatory Visit: Payer: Self-pay | Admitting: *Deleted

## 2014-04-17 DIAGNOSIS — Z1231 Encounter for screening mammogram for malignant neoplasm of breast: Secondary | ICD-10-CM

## 2014-04-17 DIAGNOSIS — M810 Age-related osteoporosis without current pathological fracture: Secondary | ICD-10-CM

## 2014-04-17 DIAGNOSIS — N951 Menopausal and female climacteric states: Secondary | ICD-10-CM

## 2014-04-17 NOTE — Telephone Encounter (Signed)
Medication refill request: Estradiol 1 mg tab Last AEX: 04/04/13 TL Next AEX: 06/15/14 PG Last MMG (if hormonal medication request): 10/31/12 BIRADS1:Neg has an appt  04/20/14 Refill authorized: 04/04/13 #90/3R. Today please advise.

## 2014-04-17 NOTE — Telephone Encounter (Signed)
Called patient and notified her that she needs her mammogram done, patient says she will call and schedule appointment for mammogram. I told her to just contact our office when it's done so we can look out for it. Patient agreeable.

## 2014-04-18 ENCOUNTER — Ambulatory Visit: Payer: Self-pay | Admitting: Obstetrics and Gynecology

## 2014-04-18 MED ORDER — ESTRADIOL 1 MG PO TABS: 1.0000 mg | ORAL_TABLET | Freq: Every day | ORAL | Status: DC

## 2014-04-20 ENCOUNTER — Ambulatory Visit (HOSPITAL_COMMUNITY)
Admission: RE | Admit: 2014-04-20 | Discharge: 2014-04-20 | Disposition: A | Discharge status: Home / Self Care | Payer: Federal, State, Local not specified - PPO | Source: Ambulatory Visit | Attending: Nurse Practitioner | Admitting: Nurse Practitioner

## 2014-04-20 DIAGNOSIS — Z1231 Encounter for screening mammogram for malignant neoplasm of breast: Secondary | ICD-10-CM | POA: Diagnosis not present

## 2014-04-20 DIAGNOSIS — Z9882 Breast implant status: Secondary | ICD-10-CM | POA: Diagnosis not present

## 2014-05-16 ENCOUNTER — Other Ambulatory Visit: Payer: Self-pay | Admitting: Nurse Practitioner

## 2014-05-17 ENCOUNTER — Other Ambulatory Visit: Payer: Self-pay | Admitting: *Deleted

## 2014-05-17 NOTE — Telephone Encounter (Signed)
Medication refill request: Estradiol 1 mg Last AEX:  04/04/13 with TL  Next AEX:  06/15/14 with PG Last MMG (if hormonal medication request): 04/23/14 Bi-Rads 1: Negative Refill authorized: #30/0 rfs, please advise.

## 2014-05-18 ENCOUNTER — Telehealth: Payer: Self-pay | Admitting: Nurse Practitioner

## 2014-05-18 NOTE — Telephone Encounter (Signed)
Pt thinks she may have a yeast infection.

## 2014-05-18 NOTE — Telephone Encounter (Signed)
Spoke with patient. Patient states that she has been experiencing vaginal discharge, itching, and irritation for 3 days. "The drainage is really irritating the skin around it." Offered to schedule appointment for patient on Monday to be seen for further evaluation but patient declines. Advised patient may be seen at local urgent care for further evaluation. Or may use OTC Monistat 3 or 5 day with hydrocortisone ointment and aveeno sitz bath. Patient is agreeable and will use Monistat at this time. Will call to schedule OV if symptoms do not resolve.  Routing to provider for final review. Patient agreeable to disposition. Will close encounter

## 2014-06-15 ENCOUNTER — Ambulatory Visit (INDEPENDENT_AMBULATORY_CARE_PROVIDER_SITE_OTHER): Payer: Federal, State, Local not specified - PPO | Admitting: Nurse Practitioner

## 2014-06-15 ENCOUNTER — Encounter: Payer: Self-pay | Admitting: Nurse Practitioner

## 2014-06-15 VITALS — BP 112/72 | HR 72 | Ht 64.75 in | Wt 167.0 lb

## 2014-06-15 DIAGNOSIS — Z01419 Encounter for gynecological examination (general) (routine) without abnormal findings: Secondary | ICD-10-CM

## 2014-06-15 DIAGNOSIS — N390 Urinary tract infection, site not specified: Secondary | ICD-10-CM | POA: Diagnosis not present

## 2014-06-15 DIAGNOSIS — Z0000 Encounter for general adult medical examination without abnormal findings: Secondary | ICD-10-CM | POA: Diagnosis not present

## 2014-06-15 DIAGNOSIS — R319 Hematuria, unspecified: Secondary | ICD-10-CM | POA: Diagnosis not present

## 2014-06-15 LAB — POCT URINALYSIS DIPSTICK
Bilirubin, UA: NEGATIVE
GLUCOSE UA: NEGATIVE
Ketones, UA: NEGATIVE
Leukocytes, UA: NEGATIVE
NITRITE UA: POSITIVE
PROTEIN UA: NEGATIVE
UROBILINOGEN UA: NEGATIVE
pH, UA: 6

## 2014-06-15 MED ORDER — ESTRADIOL 0.5 MG PO TABS: 0.5000 mg | ORAL_TABLET | Freq: Every day | ORAL | Status: DC

## 2014-06-15 MED ORDER — NITROFURANTOIN MONOHYD MACRO 100 MG PO CAPS: 100.0000 mg | ORAL_CAPSULE | Freq: Two times a day (BID) | ORAL | Status: DC

## 2014-06-15 MED ORDER — FLUCONAZOLE 150 MG PO TABS: 150.0000 mg | ORAL_TABLET | Freq: Once | ORAL | Status: DC

## 2014-06-15 NOTE — Progress Notes (Signed)
Patient ID: Yolanda Copeland, female   DOB: February 03, 1950, 64 y.o.   MRN: [id number] 64 y.o. [id number] Married  Caucasian Fe here for annual exam. No new health problems.  She had right implant was ruptured and had bilateral implant replacement in December.   Patient's last menstrual period was 01/27/1995.  Pt has had hysterectomy. Sexually active: Yes.    The current method of family planning is status post hysterectomy.    Exercising: No.  The patient does not participate in regular exercise at present. Smoker:  no  Health Maintenance: Pap:  02/28/01, negative MMG:  04/20/14, Bi-Rads 1:  Negative Colonoscopy:  2009, repeat in 5 years, pt will schedule when husband is able to drive BMD:   3/53/61, T Score -1.2 S/-2.0 R/-2.4 L (off Fosamax/ Boniva for several years) TDaP:  01/27/11 Labs:  PCP in EPIC  Urine: + nitrates, trace RBC   reports that she quit smoking about 6 years ago. Her smoking use included Cigarettes. She smoked 0.50 packs per day. She has never used smokeless tobacco. She reports that she drinks about 1.2 - 1.8 oz of alcohol per week. She reports that she does not use illicit drugs.  Past Medical History  Diagnosis Date  . Hyperlipemia   . Hypertension   . Abnormal CXR   . COPD (chronic obstructive pulmonary disease)   . Broken ankle 12/10    atraumatic fracture   . GERD (gastroesophageal reflux disease)   . Migraine without aura   . Depression   . Chicken pox   . Allergy   . Migraines   . Colon polyps     on 5 year recalldr Gamin   . Cervicalgia   . Osteoporosis, unspecified   . Encounter for long-term (current) use of other medications   . Unspecified vitamin D deficiency   . Hx of urinary stone     renal seen as incidental finding on x ray  . Hx of varicella   . H/O amblyopia     hx patching left eye    Past Surgical History  Procedure Laterality Date  . Vesicovaginal fistula closure w/ tah  1997  . Breast enhancement surgery  1989, 2003, 12/2013    x2; 2015  implants replaced  . Appendectomy  1997  . Ankle surgery  1997    plate fell   . Total vaginal hysterectomy  spring 1999    menorrhagia; suspicious cells at cervix and fibroids   . Bilateral salpingoophorectomy Bilateral spring 1999    3 days after [facility] pathology was suspicious and went back with TAH and had BSO  . Shoulder surgery Right 09/15/2013    Current Outpatient Prescriptions  Medication Sig Dispense Refill  . acetaminophen (TYLENOL) 500 MG tablet Take 500 mg by mouth every 6 (six) hours as needed.      . ALPRAZolam (XANAX) 1 MG tablet as needed.    Marland Kitchen aspirin 81 MG tablet Take 81 mg by mouth daily.    Marland Kitchen atorvastatin (LIPITOR) 20 MG tablet Take 1 tablet (20 mg total) by mouth daily. 90 tablet 1  . Calcium Carbonate (CALCIUM 600 PO) Take 1 tablet by mouth daily.    . Cholecalciferol (VITAMIN D3) 2000 UNITS TABS Once daily     . famotidine (PEPCID) 10 MG tablet Take 10 mg by mouth 2 (two) times daily.    . fexofenadine (ALLEGRA) 180 MG tablet Take 180 mg by mouth daily.    . fluticasone (FLONASE) 50 MCG/ACT nasal spray Place  1 spray into both nostrils daily.    Marland Kitchen losartan (COZAAR) 50 MG tablet TAKE 1 TABLET (50 MG TOTAL) BY MOUTH DAILY. 90 tablet 2  . Multiple Vitamins-Minerals (CENTRUM SILVER PO) Once daily     . PRISTIQ 50 MG 24 hr tablet     . TRAZODONE HCL PO Take 50 mg by mouth as needed.     Marland Kitchen estradiol (ESTRACE) 0.5 MG tablet Take 1 tablet (0.5 mg total) by mouth daily. 90 tablet 3  . fluconazole (DIFLUCAN) 150 MG tablet Take 1 tablet (150 mg total) by mouth once. Take one tablet.  Repeat in 48 hours if symptoms are not completely resolved. 2 tablet 0  . nitrofurantoin, macrocrystal-monohydrate, (MACROBID) 100 MG capsule Take 1 capsule (100 mg total) by mouth 2 (two) times daily. 14 capsule 0   No current facility-administered medications for this visit.    Family History  Problem Relation Age of Onset  . Heart disease Father     died complications GB surgery perf and  sepsis  . Hypertension Father   . Alcohol abuse Father   . Hyperlipidemia Father   . Dupuytren's contracture Father   . Diabetes Maternal Grandmother   . Hyperlipidemia Mother   . Dementia Mother     Peabody dementia and Parkinson's  . Alcohol abuse Brother   . Hyperlipidemia Brother   . Hypertension Brother   . Diabetes Brother   . Dupuytren's contracture Brother   . Alcohol abuse Brother   . Hyperlipidemia Brother   . Cervical cancer Paternal Aunt     Cervical Cancer  . Heart disease Paternal Grandfather     ROS:  Pertinent items are noted in HPI.  Otherwise, a comprehensive ROS was negative.  Exam:   BP 112/72 mmHg  Pulse 72  Ht 5' 4.75" (1.645 m)  Wt 167 lb (75.751 kg)  BMI 27.99 kg/m2  LMP 01/27/1995 Height: 5' 4.75" (164.5 cm) Ht Readings from Last 3 Encounters:  06/15/14 5' 4.75" (1.645 m)  01/30/14 5\' 5"  (1.651 m)  11/29/13 5\' 5"  (1.651 m)    General appearance: alert, cooperative and appears stated age Head: Normocephalic, without obvious abnormality, atraumatic Neck: no adenopathy, supple, symmetrical, trachea midline and thyroid normal to inspection and palpation Lungs: clear to auscultation bilaterally Breasts: normal appearance, no masses or tenderness, positive findings: implants bilaterally Heart: regular rate and rhythm Abdomen: soft, non-tender; no masses,  no organomegaly Extremities: extremities normal, atraumatic, no cyanosis or edema Skin: Skin color, texture, turgor normal. No rashes or lesions Lymph nodes: Cervical, supraclavicular, and axillary nodes normal. No abnormal inguinal nodes palpated Neurologic: Grossly normal   Pelvic: External genitalia:  no lesions              Urethra:  normal appearing urethra with no masses, tenderness or lesions              Bartholin's and Skene's: normal                 Vagina: normal appearing vagina with normal color and discharge, no lesions              Cervix: absent              Pap taken:  No. Bimanual Exam:  Uterus:  uterus absent              Adnexa: no mass, fullness, tenderness               Rectovaginal: Confirms  Anus:  normal sphincter tone, no lesions  Chaperone present:  no   A:  Well Woman with normal exam  S/P TVH/ TAH for fibroids and abnormal pathology then had BSO 3 days later-  spring 1999 on ERT  Osteoporosis - followed by PCP - some discussion about Prolia but waiting for next BMD  R/O UTI  P:   Reviewed health and wellness pertinent to exam  Pap smear as above  Mammogram is due 3/17  She has agreed to decrease dose of ERT - Estradiol to 0.5 mg daily for a year  Counseled with risk of CVA, DVT, cancer, etc.  Started on Macrobid 100 mg Bid - follow with urine C&S  Counseled on breast self exam, mammography screening, use and side effects of HRT, adequate intake of calcium and vitamin D, diet and exercise, Kegel's exercises return annually or prn  An After Visit Summary was printed and given to the patient.

## 2014-06-15 NOTE — Patient Instructions (Signed)

## 2014-06-16 LAB — URINALYSIS, MICROSCOPIC ONLY
CASTS: NONE SEEN
CRYSTALS: NONE SEEN

## 2014-06-17 LAB — URINE CULTURE

## 2014-06-17 NOTE — Progress Notes (Signed)
Encounter reviewed by Dr. Kapena Hamme Silva.  

## 2014-06-18 ENCOUNTER — Other Ambulatory Visit: Payer: Self-pay | Admitting: Family Medicine

## 2014-06-18 ENCOUNTER — Other Ambulatory Visit: Payer: Self-pay | Admitting: Internal Medicine

## 2014-06-18 ENCOUNTER — Telehealth: Payer: Self-pay | Admitting: Family Medicine

## 2014-06-18 DIAGNOSIS — R739 Hyperglycemia, unspecified: Secondary | ICD-10-CM

## 2014-06-18 NOTE — Telephone Encounter (Signed)
Filled for 3 months.  Pt is past due for her A1C.  Message sent to scheduling.

## 2014-06-18 NOTE — Telephone Encounter (Signed)
Pt has been sch for 5-26 at 10 am

## 2014-06-18 NOTE — Telephone Encounter (Signed)
Pt is past due for her A1C.  Please help the pt to make a lab appt.  I have entered the order.  Thanks!

## 2014-06-21 ENCOUNTER — Other Ambulatory Visit (INDEPENDENT_AMBULATORY_CARE_PROVIDER_SITE_OTHER): Payer: Federal, State, Local not specified - PPO

## 2014-06-21 DIAGNOSIS — R739 Hyperglycemia, unspecified: Secondary | ICD-10-CM

## 2014-06-21 LAB — HEMOGLOBIN A1C: HEMOGLOBIN A1C: 5.7 % (ref 4.6–6.5)

## 2014-07-12 ENCOUNTER — Telehealth: Payer: Self-pay | Admitting: Nurse Practitioner

## 2014-07-12 NOTE — Telephone Encounter (Signed)
Patient is having problems with hot flashes. Last seen 06/15/14.

## 2014-07-12 NOTE — Telephone Encounter (Signed)
Patient should be adjusting to change soon, usually takes about 1-2 months for this to occur. Does not need to increase dosage due to age. Try taking at a different time of day which may help.

## 2014-07-12 NOTE — Telephone Encounter (Signed)
Spoke with patient. Patient last seen on 06/15/2014 for aex with Yolanda Cage, FNP. At that appointment patient's Estradiol was changed from 1mg  daily to .5mg  daily. States since making change she has been "miserable." Is having continual hot flashes and night sweats. Patient requesting to go back on Estradiol 1mg  daily. Advised will speak with provider regarding symptoms and medication change and return call with further recommendations. Patient is agreeable.

## 2014-07-12 NOTE — Telephone Encounter (Signed)
Spoke with patient. Advised of message as seen below from [place]. Patient is agreeable and verbalizes understanding.  Routing to provider for final review. Patient agreeable to disposition. Will close encounter.

## 2014-07-23 LAB — HM COLONOSCOPY

## 2014-07-25 ENCOUNTER — Encounter: Payer: Self-pay | Admitting: Family Medicine

## 2014-08-21 ENCOUNTER — Ambulatory Visit (INDEPENDENT_AMBULATORY_CARE_PROVIDER_SITE_OTHER): Payer: Federal, State, Local not specified - PPO | Admitting: Internal Medicine

## 2014-08-21 ENCOUNTER — Encounter: Payer: Self-pay | Admitting: Internal Medicine

## 2014-08-21 VITALS — BP 130/80 | Temp 98.0°F | Ht 64.5 in | Wt 171.0 lb

## 2014-08-21 DIAGNOSIS — I10 Essential (primary) hypertension: Secondary | ICD-10-CM | POA: Diagnosis not present

## 2014-08-21 DIAGNOSIS — Z0000 Encounter for general adult medical examination without abnormal findings: Secondary | ICD-10-CM | POA: Diagnosis not present

## 2014-08-21 DIAGNOSIS — M810 Age-related osteoporosis without current pathological fracture: Secondary | ICD-10-CM

## 2014-08-21 DIAGNOSIS — Z23 Encounter for immunization: Secondary | ICD-10-CM | POA: Diagnosis not present

## 2014-08-21 DIAGNOSIS — E785 Hyperlipidemia, unspecified: Secondary | ICD-10-CM | POA: Diagnosis not present

## 2014-08-21 DIAGNOSIS — R7301 Impaired fasting glucose: Secondary | ICD-10-CM | POA: Diagnosis not present

## 2014-08-21 DIAGNOSIS — R739 Hyperglycemia, unspecified: Secondary | ICD-10-CM

## 2014-08-21 LAB — CBC WITH DIFFERENTIAL/PLATELET
BASOS ABS: 0 10*3/uL (ref 0.0–0.1)
BASOS PCT: 0.7 % (ref 0.0–3.0)
Eosinophils Absolute: 0.1 10*3/uL (ref 0.0–0.7)
Eosinophils Relative: 1.5 % (ref 0.0–5.0)
HEMATOCRIT: 42.4 % (ref 36.0–46.0)
HEMOGLOBIN: 14.4 g/dL (ref 12.0–15.0)
LYMPHS ABS: 1.6 10*3/uL (ref 0.7–4.0)
Lymphocytes Relative: 37.6 % (ref 12.0–46.0)
MCHC: 34 g/dL (ref 30.0–36.0)
MCV: 87.6 fl (ref 78.0–100.0)
MONO ABS: 0.3 10*3/uL (ref 0.1–1.0)
Monocytes Relative: 7.9 % (ref 3.0–12.0)
Neutro Abs: 2.3 10*3/uL (ref 1.4–7.7)
Neutrophils Relative %: 52.3 % (ref 43.0–77.0)
Platelets: 248 10*3/uL (ref 150.0–400.0)
RBC: 4.84 Mil/uL (ref 3.87–5.11)
RDW: 12.8 % (ref 11.5–15.5)
WBC: 4.3 10*3/uL (ref 4.0–10.5)

## 2014-08-21 LAB — HEPATIC FUNCTION PANEL
ALT: 35 U/L (ref 0–35)
AST: 27 U/L (ref 0–37)
Albumin: 4.2 g/dL (ref 3.5–5.2)
Alkaline Phosphatase: 85 U/L (ref 39–117)
BILIRUBIN DIRECT: 0.1 mg/dL (ref 0.0–0.3)
BILIRUBIN TOTAL: 0.6 mg/dL (ref 0.2–1.2)
TOTAL PROTEIN: 7.1 g/dL (ref 6.0–8.3)

## 2014-08-21 LAB — TSH: TSH: 1.4 u[IU]/mL (ref 0.35–4.50)

## 2014-08-21 LAB — LIPID PANEL
CHOL/HDL RATIO: 3
CHOLESTEROL: 201 mg/dL — AB (ref 0–200)
HDL: 59.6 mg/dL (ref >39.00)
LDL Cholesterol: 109 mg/dL — ABNORMAL HIGH (ref 0–99)
NonHDL: 141.4
Triglycerides: 164 mg/dL — ABNORMAL HIGH (ref 0.0–149.0)
VLDL: 32.8 mg/dL (ref 0.0–40.0)

## 2014-08-21 LAB — BASIC METABOLIC PANEL
BUN: 17 mg/dL (ref 6–23)
CALCIUM: 9.5 mg/dL (ref 8.4–10.5)
CO2: 30 mEq/L (ref 19–32)
Chloride: 102 mEq/L (ref 96–112)
Creatinine, Ser: 0.76 mg/dL (ref 0.40–1.20)
GFR: 81.99 mL/min (ref >60.00)
GLUCOSE: 124 mg/dL — AB (ref 70–99)
POTASSIUM: 4.1 meq/L (ref 3.5–5.1)
Sodium: 140 mEq/L (ref 135–145)

## 2014-08-21 NOTE — Progress Notes (Signed)
Pre visit review using our clinic review tool, if applicable. No additional management support is needed unless otherwise documented below in the visit note.  Chief Complaint  Patient presents with  . Annual Exam    HPI: Patient  Yolanda Copeland  64 y.o. comes in today for Preventive Health Care visit    Gained weight   .  caretaking   Husband had spinal surgery .  interested in prolia for her osteoporosis  Had se of boniva fosamax and one other .  GI   resp  Fine on flonase and allergy med    HT taking med nose of med at this time Health Maintenance  Topic Date Due  . PAP SMEAR  02/29/2004  . HIV Screening  07/27/2015 (Originally 09/26/1967)  . INFLUENZA VACCINE  08/27/2014  . MAMMOGRAM  04/19/2016  . TETANUS/TDAP  07/18/2023  . COLONOSCOPY  07/22/2024  . ZOSTAVAX  Completed   Health Maintenance Review LIFESTYLE:  Exercise:  Had been  Exercise bike to get back with this  Tobacco/ETS:no ex tobacco Alcohol: per day ocass Sugar beverages: Sleep:7-9hours  Drug use: no   ROS:  GEN/ HEENT: No fever, significant weight changes sweats headaches vision problems hearing changes, CV/ PULM; No chest pain shortness of breath cough, syncope,edema  change in exercise tolerance. GI /GU: No adominal pain, vomiting, change in bowel habits. No blood in the stool. No significant GU symptoms. SKIN/HEME: ,no acute skin rashes suspicious lesions or bleeding. No lymphadenopathy, nodules, masses.  NEURO/ PSYCH:  No neurologic signs such as weakness numbness. No depression anxiety. IMM/ Allergy: No unusual infections.  Allergy .   REST of 12 system review negative except as per HPI   Past Medical History  Diagnosis Date  . Hyperlipemia   . Hypertension   . Abnormal CXR   . COPD (chronic obstructive pulmonary disease)   . Broken ankle 12/10    atraumatic fracture   . GERD (gastroesophageal reflux disease)   . Migraine without aura   . Depression   . Chicken pox   . Allergy   .  Migraines   . Colon polyps     on 5 year recalldr Gamin   . Cervicalgia   . Osteoporosis, unspecified   . Encounter for long-term (current) use of other medications   . Unspecified vitamin D deficiency   . Hx of urinary stone     renal seen as incidental finding on x ray  . Hx of varicella   . H/O amblyopia     hx patching left eye    Past Surgical History  Procedure Laterality Date  . Vesicovaginal fistula closure w/ tah  1997  . Breast enhancement surgery  1989, 2003, 12/2013    x2; 2015 implants replaced  . Appendectomy  1997  . Ankle surgery  1997    plate fell   . Total vaginal hysterectomy  spring 1999    menorrhagia; suspicious cells at cervix and fibroids   . Bilateral salpingoophorectomy Bilateral spring 1999    3 days after [facility] pathology was suspicious and went back with TAH and had BSO  . Shoulder surgery Right 09/15/2013    Family History  Problem Relation Age of Onset  . Heart disease Father     died complications GB surgery perf and sepsis  . Hypertension Father   . Alcohol abuse Father   . Hyperlipidemia Father   . Dupuytren's contracture Father   . Diabetes Maternal Grandmother   . Hyperlipidemia Mother   .  Dementia Mother     Peabody dementia and Parkinson's  . Alcohol abuse Brother   . Hyperlipidemia Brother   . Hypertension Brother   . Diabetes Brother   . Dupuytren's contracture Brother   . Alcohol abuse Brother   . Hyperlipidemia Brother   . Cervical cancer Paternal Aunt     Cervical Cancer  . Heart disease Paternal Grandfather     History   Social History  . Marital Status: Married    Spouse Name: N/A  . Number of Children: N/A  . Years of Education: N/A   Occupational History  . Retired    Social History Main Topics  . Smoking status: Former Smoker -- 0.50 packs/day    Types: Cigarettes    Quit date: 12/27/2007  . Smokeless tobacco: Never Used  . Alcohol Use: 1.2 - 1.8 oz/week    2-3 Glasses of wine per week  . Drug Use: No    . Sexual Activity:    Partners: Male    Birth Control/ Protection: Surgical     Comment: Hysterectomy   Other Topics Concern  . None   Social History Narrative   6-9 hours of sleep per night   Lives with her husband  hh of 2    Has 1 dog and a few fish   Retired on 06/09/13   [place] [facility] [place]    \husband worked for [employer]   She worked [employer]   G5 G2   [place] no tob ogg for6 years onset age 89 years    Neg ets FA pos safely zx 3           Outpatient Prescriptions Prior to Visit  Medication Sig Dispense Refill  . acetaminophen (TYLENOL) 500 MG tablet Take 500 mg by mouth every 6 (six) hours as needed.      . ALPRAZolam (XANAX) 1 MG tablet as needed.    Marland Kitchen aspirin 81 MG tablet Take 81 mg by mouth daily.    Marland Kitchen atorvastatin (LIPITOR) 20 MG tablet TAKE 1 TABLET (20 MG TOTAL) BY MOUTH DAILY. 90 tablet 0  . Calcium Carbonate (CALCIUM 600 PO) Take 1 tablet by mouth daily.    . Cholecalciferol (VITAMIN D3) 2000 UNITS TABS Once daily     . estradiol (ESTRACE) 0.5 MG tablet Take 1 tablet (0.5 mg total) by mouth daily. 90 tablet 3  . famotidine (PEPCID) 10 MG tablet Take 10 mg by mouth 2 (two) times daily.    . fexofenadine (ALLEGRA) 180 MG tablet Take 180 mg by mouth daily.    . fluticasone (FLONASE) 50 MCG/ACT nasal spray Place 1 spray into both nostrils daily.    Marland Kitchen losartan (COZAAR) 50 MG tablet TAKE 1 TABLET (50 MG TOTAL) BY MOUTH DAILY. 90 tablet 2  . Multiple Vitamins-Minerals (CENTRUM SILVER PO) Once daily     . PRISTIQ 50 MG 24 hr tablet     . TRAZODONE HCL PO Take 50 mg by mouth as needed.     . fluconazole (DIFLUCAN) 150 MG tablet Take 1 tablet (150 mg total) by mouth once. Take one tablet.  Repeat in 48 hours if symptoms are not completely resolved. 2 tablet 0  . nitrofurantoin, macrocrystal-monohydrate, (MACROBID) 100 MG capsule Take 1 capsule (100 mg total) by mouth 2 (two) times daily. 14 capsule 0   No facility-administered medications prior to visit.      EXAM:  BP 130/80 mmHg  Temp(Src) 98 F (36.7 C) (Oral)  Ht 5' 4.5" (  1.638 m)  Wt 171 lb (77.565 kg)  BMI 28.91 kg/m2  LMP 01/27/1995  Body mass index is 28.91 kg/(m^2).  Physical Exam: Vital signs reviewed OHY:WVPX is a well-developed well-nourished alert cooperative    who appearsr stated age in no acute distress.  HEENT: normocephalic atraumatic , Eyes: PERRL EOM's full, conjunctiva clear, Nares: paten,t no deformity discharge or tenderness., Ears: no deformity EAC's clear TMs with normal landmarks. Mouth: clear OP, no lesions, edema.  Moist mucous membranes. Dentition in adequate repair. NECK: supple without masses, thyromegaly or bruits. CHEST/PULM:  Clear to auscultation and percussion breath sounds equal no wheeze , rales or rhonchi. No chest wall deformities or tenderness.breast no nodules  Breast implants  CV: PMI is nondisplaced, S1 S2 no gallops, murmurs, rubs. Peripheral pulses are full without delay.No JVD .  ABDOMEN: Bowel sounds normal nontender  No guard or rebound, no hepato splenomegal no CVA tenderness.  No hernia. Extremtities:  No clubbing cyanosis or edema, no acute joint swelling or redness no focal atrophy NEURO:  Oriented x3, cranial nerves 3-12 appear to be intact, no obvious focal weakness,gait within normal limits no abnormal reflexes or asymmetrical SKIN: No acute rashes normal turgor, color, no bruising or petechiae. PSYCH: Oriented, good eye contact, no obvious depression anxiety, cognition and judgment appear normal. LN: no cervical axillary inguinal adenopathy EKG nsr  Poor r wave progression prob normal for her .  No qs  ASSESSMENT AND PLAN:  Discussed the following assessment and plan:  Visit for preventive health examination - Plan: Basic metabolic panel, CBC with Differential/Platelet, Hepatic function panel, Lipid panel, TSH, EKG 12-Lead  Fasting hyperglycemia  Essential hypertension - Plan: Basic metabolic panel, CBC with  Differential/Platelet, Hepatic function panel, Lipid panel, TSH, EKG 12-Lead  Hyperlipidemia - Plan: Basic metabolic panel, CBC with Differential/Platelet, Hepatic function panel, Lipid panel, TSH, EKG 12-Lead  Hyperglycemia  Hyperlipemia  Osteoporosis  Need for shingles vaccine - Plan: Varicella-zoster vaccine subcutaneous cpx labs today  lsi monitor bp readings  Order for prolia if  Labs acceptable  Health Maintenance Due  Topic Date Due  . PAP SMEAR  02/29/2004    Patient Care Team: Burnis Medin, MD as PCP - General (Internal Medicine) Elveria Rising, MD as Consulting Physician (Obstetrics and Gynecology) Belva Chimes, MD as Referring Physician (Orthopedic Surgery) Massie Bougie, MD as Referring Physician Chucky May, MD as Consulting Physician (Psychiatry) Patient Instructions    Will notify you  of labs when available.  Healthy lifestyle includes : At least 150 minutes of exercise weeks  , weight at healthy levels, which is usually   BMI 19-25. Avoid trans fats and processed foods;  Increase fresh fruits and veges to 5 servings per day. And avoid sweet beverages including tea and juice. Mediterranean diet with olive oil and nuts have been noted to be heart and brain healthy . Avoid tobacco products . Limit  alcohol to  7 per week for women and 14 servings for men.  Get adequate sleep . Wear seat belts . Don't text and drive .   Tracking will help  Change  Eating and activity behaviors .  Take blood pressure readings twice a day for 7- 10 days and then periodically .To ensure below 140/90   .     prolia  Is an injection every 6 months  And so far has a good track record as to side efects.     Standley Brooking. Panosh M.D.

## 2014-08-21 NOTE — Patient Instructions (Addendum)
   Will notify you  of labs when available.  Healthy lifestyle includes : At least 150 minutes of exercise weeks  , weight at healthy levels, which is usually   BMI 19-25. Avoid trans fats and processed foods;  Increase fresh fruits and veges to 5 servings per day. And avoid sweet beverages including tea and juice. Mediterranean diet with olive oil and nuts have been noted to be heart and brain healthy . Avoid tobacco products . Limit  alcohol to  7 per week for women and 14 servings for men.  Get adequate sleep . Wear seat belts . Don't text and drive .   Tracking will help  Change  Eating and activity behaviors .  Take blood pressure readings twice a day for 7- 10 days and then periodically .To ensure below 140/90   .     prolia  Is an injection every 6 months  And so far has a good track record as to side efects.

## 2014-08-28 ENCOUNTER — Encounter: Payer: Self-pay | Admitting: Family Medicine

## 2014-09-13 ENCOUNTER — Other Ambulatory Visit: Payer: Self-pay | Admitting: Internal Medicine

## 2014-09-14 NOTE — Telephone Encounter (Signed)
Sent to the pharmacy by e-scribe. 

## 2014-10-08 ENCOUNTER — Telehealth: Payer: Self-pay | Admitting: Internal Medicine

## 2014-10-08 NOTE — Telephone Encounter (Signed)
Pt states she called Friday to get pre-approved for prolia inj Do you know anything about this? Thanks!

## 2014-10-08 NOTE — Telephone Encounter (Signed)
Called patient and advised her that we are still waiting to hear back on her Prolia insurance verification.

## 2014-10-08 NOTE — Telephone Encounter (Signed)
Will check with Estill Bamberg

## 2014-10-17 ENCOUNTER — Telehealth: Payer: Self-pay | Admitting: Internal Medicine

## 2014-10-17 NOTE — Telephone Encounter (Signed)
Due to her insurance benefit patient would like to have her Prolia called to [facility]. She will use a copay card and then bring here to the office for her injection. That will require a $25.00 office visit copay and she is aware.

## 2014-10-19 MED ORDER — DENOSUMAB 60 MG/ML ~~LOC~~ SOLN: 60.0000 mg | Freq: Once | SUBCUTANEOUS | Status: DC

## 2014-10-19 NOTE — Telephone Encounter (Signed)
Medication sent to the pharmacy.  Pt notified. 

## 2014-11-02 ENCOUNTER — Ambulatory Visit: Payer: Federal, State, Local not specified - PPO | Admitting: Family Medicine

## 2014-11-09 ENCOUNTER — Ambulatory Visit (INDEPENDENT_AMBULATORY_CARE_PROVIDER_SITE_OTHER): Payer: Federal, State, Local not specified - PPO | Admitting: Family Medicine

## 2014-11-09 DIAGNOSIS — Z23 Encounter for immunization: Secondary | ICD-10-CM

## 2014-11-09 DIAGNOSIS — M810 Age-related osteoporosis without current pathological fracture: Secondary | ICD-10-CM | POA: Diagnosis not present

## 2014-11-09 MED ORDER — DENOSUMAB 60 MG/ML ~~LOC~~ SOLN
60.0000 mg | Freq: Once | SUBCUTANEOUS | Status: AC
  Administered 2014-11-09: 60 mg via SUBCUTANEOUS

## 2014-12-03 ENCOUNTER — Other Ambulatory Visit: Payer: Self-pay | Admitting: Internal Medicine

## 2014-12-05 NOTE — Telephone Encounter (Signed)
Sent to the pharmacy by e-scribe.  Pt has upcoming cpx scheduled for 08/21/15

## 2015-03-22 ENCOUNTER — Other Ambulatory Visit: Payer: Self-pay

## 2015-03-22 DIAGNOSIS — Z1231 Encounter for screening mammogram for malignant neoplasm of breast: Secondary | ICD-10-CM

## 2015-03-22 DIAGNOSIS — Z9882 Breast implant status: Secondary | ICD-10-CM

## 2015-04-22 ENCOUNTER — Ambulatory Visit
Admission: RE | Admit: 2015-04-22 | Discharge: 2015-04-22 | Disposition: A | Discharge status: Home / Self Care | Payer: Federal, State, Local not specified - PPO | Source: Ambulatory Visit

## 2015-04-22 DIAGNOSIS — Z1231 Encounter for screening mammogram for malignant neoplasm of breast: Secondary | ICD-10-CM

## 2015-04-22 DIAGNOSIS — Z9882 Breast implant status: Secondary | ICD-10-CM

## 2015-04-24 ENCOUNTER — Other Ambulatory Visit: Payer: Self-pay | Admitting: Internal Medicine

## 2015-04-24 DIAGNOSIS — R928 Other abnormal and inconclusive findings on diagnostic imaging of breast: Secondary | ICD-10-CM

## 2015-04-26 DIAGNOSIS — G5701 Lesion of sciatic nerve, right lower limb: Secondary | ICD-10-CM | POA: Insufficient documentation

## 2015-05-01 ENCOUNTER — Ambulatory Visit
Admission: RE | Admit: 2015-05-01 | Discharge: 2015-05-01 | Disposition: A | Discharge status: Home / Self Care | Payer: Federal, State, Local not specified - PPO | Source: Ambulatory Visit | Attending: Internal Medicine | Admitting: Internal Medicine

## 2015-05-01 DIAGNOSIS — R928 Other abnormal and inconclusive findings on diagnostic imaging of breast: Secondary | ICD-10-CM

## 2015-05-08 ENCOUNTER — Ambulatory Visit (INDEPENDENT_AMBULATORY_CARE_PROVIDER_SITE_OTHER): Payer: Federal, State, Local not specified - PPO | Admitting: Family Medicine

## 2015-05-08 DIAGNOSIS — M810 Age-related osteoporosis without current pathological fracture: Secondary | ICD-10-CM | POA: Diagnosis not present

## 2015-05-08 MED ORDER — DENOSUMAB 60 MG/ML ~~LOC~~ SOLN
60.0000 mg | Freq: Once | SUBCUTANEOUS | Status: AC
  Administered 2015-05-08: 60 mg via SUBCUTANEOUS

## 2015-06-07 ENCOUNTER — Other Ambulatory Visit: Payer: Self-pay | Admitting: Internal Medicine

## 2015-06-07 NOTE — Telephone Encounter (Signed)
Sent to the pharmacy by e-scribe for 90 days.  Pt has upcoming cpx in July. 

## 2015-06-13 ENCOUNTER — Other Ambulatory Visit: Payer: Self-pay | Admitting: Nurse Practitioner

## 2015-06-13 NOTE — Telephone Encounter (Signed)
Medication refill request: Estradiol  Last AEX:  06-15-14  Next AEX: 06-25-15 Last MMG (if hormonal medication request): 05-03-15 -breast U/S WNL  Refill authorized: please advise

## 2015-06-25 ENCOUNTER — Encounter: Payer: Self-pay | Admitting: Nurse Practitioner

## 2015-06-25 ENCOUNTER — Ambulatory Visit (INDEPENDENT_AMBULATORY_CARE_PROVIDER_SITE_OTHER): Payer: Federal, State, Local not specified - PPO | Admitting: Nurse Practitioner

## 2015-06-25 VITALS — BP 118/76 | HR 72 | Ht 64.75 in | Wt 170.0 lb

## 2015-06-25 DIAGNOSIS — Z01419 Encounter for gynecological examination (general) (routine) without abnormal findings: Secondary | ICD-10-CM | POA: Diagnosis not present

## 2015-06-25 DIAGNOSIS — Z0000 Encounter for general adult medical examination without abnormal findings: Secondary | ICD-10-CM

## 2015-06-25 LAB — HIV ANTIBODY (ROUTINE TESTING W REFLEX): HIV: NONREACTIVE

## 2015-06-25 MED ORDER — ESTRADIOL 0.5 MG PO TABS: ORAL_TABLET | ORAL | Status: DC

## 2015-06-25 NOTE — Progress Notes (Signed)
Patient ID: Yolanda Copeland, female   DOB: November 29, 1950, 65 y.o.   MRN: [id number]  65 y.o. [id number] Married  Caucasian Fe here for annual exam.  Still having a lot of vaso symptoms.  Pain persist in right hip and will be doing PT.  First Prolia injection 11/09/14 and 05/08/15.  Taking grand kids ages 42 & 55 to [place].  The parents are also going.  She has been retired for several yrs now.  Husband also retired due to spinal fusion on his neck and unable to work.  Patient's last menstrual period was 01/27/1995 (approximate).          Sexually active: Yes.    The current method of family planning is status post hysterectomy.    Exercising: Yes.    Gym/ health club routine includes treadmill and swiming 3 times per week. Smoker:  no  Health Maintenance: Pap: 02/28/01, negative MMG:04/22/15 with left breast diagnostic mammogram and ultrasound; Bi-Rads 1:  Negative, screening mammogram in one year Colonoscopy: 07/23/14, Normal, repeat in 5 years BMD: 04/21/13, T Score -1.2 S/-2.0 R/-2.4 L (off Fosamax/ Boniva for several years) TDaP:07/17/13 Shingles: 08/21/14 Pneumonia: Prevnar 13, 07/17/13 Hep C and HIV: done today Labs: Dr. Regis Bill in [facility]   reports that she quit smoking about 7 years ago. Her smoking use included Cigarettes. She smoked 0.50 packs per day. She has never used smokeless tobacco. She reports that she drinks about 1.2 - 1.8 oz of alcohol per week. She reports that she does not use illicit drugs.  Past Medical History  Diagnosis Date  . Hyperlipemia   . Hypertension   . Abnormal CXR   . COPD (chronic obstructive pulmonary disease) ([place])   . Broken ankle 12/10    atraumatic fracture   . GERD (gastroesophageal reflux disease)   . Migraine without aura   . Depression   . Chicken pox   . Allergy   . Migraines   . Colon polyps     on 5 year recalldr Gamin   . Cervicalgia   . Osteoporosis, unspecified   . Encounter for long-term (current) use of other medications   .  Unspecified vitamin D deficiency   . Hx of urinary stone     renal seen as incidental finding on x ray  . Hx of varicella   . H/O amblyopia     hx patching left eye    Past Surgical History  Procedure Laterality Date  . Vesicovaginal fistula closure w/ tah  1997  . Breast enhancement surgery  1989, 2003, 12/2013    x2; 2015 implants replaced  . Appendectomy  1997  . Ankle surgery  1997    plate fell   . Total vaginal hysterectomy  spring 1999    menorrhagia; suspicious cells at cervix and fibroids   . Bilateral salpingoophorectomy Bilateral spring 1999    3 days after [facility] pathology was suspicious and went back with TAH and had BSO  . Shoulder surgery Right 09/15/2013    Current Outpatient Prescriptions  Medication Sig Dispense Refill  . acetaminophen (TYLENOL) 500 MG tablet Take 500 mg by mouth every 6 (six) hours as needed.      . ALPRAZolam (XANAX) 1 MG tablet as needed.    Marland Kitchen aspirin 81 MG tablet Take 81 mg by mouth daily.    Marland Kitchen atorvastatin (LIPITOR) 20 MG tablet TAKE 1 TABLET (20 MG TOTAL) BY MOUTH DAILY. 90 tablet 0  . Calcium Carbonate (CALCIUM 600 PO) Take 1 tablet  by mouth daily.    . Cholecalciferol (VITAMIN D3) 2000 UNITS TABS Once daily     . denosumab (PROLIA) 60 MG/ML SOLN injection Inject 60 mg into the skin once. Administer in upper arm, thigh, or abdomen 1 each 1  . estradiol (ESTRACE) 0.5 MG tablet TAKE 1 TABLET (0.5 MG TOTAL) BY MOUTH DAILY. 90 tablet 4  . famotidine (PEPCID) 10 MG tablet Take 10 mg by mouth 2 (two) times daily.    . fexofenadine (ALLEGRA) 180 MG tablet Take 180 mg by mouth daily.    . fluticasone (FLONASE) 50 MCG/ACT nasal spray Place 1 spray into both nostrils daily.    Marland Kitchen losartan (COZAAR) 50 MG tablet TAKE 1 TABLET (50 MG TOTAL) BY MOUTH DAILY. 90 tablet 2  . Multiple Vitamins-Minerals (CENTRUM SILVER PO) Once daily     . PRISTIQ 50 MG 24 hr tablet     . TRAZODONE HCL PO Take 50 mg by mouth as needed.      No current facility-administered  medications for this visit.    Family History  Problem Relation Age of Onset  . Heart disease Father     died complications GB surgery perf and sepsis  . Hypertension Father   . Alcohol abuse Father   . Hyperlipidemia Father   . Dupuytren's contracture Father   . Diabetes Maternal Grandmother   . Hyperlipidemia Mother   . Dementia Mother     [place] dementia and Parkinson's  . Alcohol abuse Brother   . Hyperlipidemia Brother   . Hypertension Brother   . Diabetes Brother   . Dupuytren's contracture Brother   . Alcohol abuse Brother   . Hyperlipidemia Brother   . Cervical cancer Paternal Aunt     Cervical Cancer  . Heart disease Paternal Grandfather     ROS:  Pertinent items are noted in HPI.  Otherwise, a comprehensive ROS was negative.  Exam:   BP 118/76 mmHg  Pulse 72  Ht 5' 4.75" (1.645 m)  Wt 170 lb (77.111 kg)  BMI 28.50 kg/m2  LMP 01/27/1995 (Approximate) Height: 5' 4.75" (164.5 cm) Ht Readings from Last 3 Encounters:  06/25/15 5' 4.75" (1.645 m)  08/21/14 5' 4.5" (1.638 m)  06/15/14 5' 4.75" (1.645 m)    General appearance: alert, cooperative and appears stated age Head: Normocephalic, without obvious abnormality, atraumatic Neck: no adenopathy, supple, symmetrical, trachea midline and thyroid normal to inspection and palpation Lungs: clear to auscultation bilaterally Breasts: normal appearance, no masses or tenderness Heart: regular rate and rhythm Abdomen: soft, non-tender; no masses,  no organomegaly Extremities: extremities normal, atraumatic, no cyanosis or edema Skin: Skin color, texture, turgor normal. No rashes or lesions Lymph nodes: Cervical, supraclavicular, and axillary nodes normal. No abnormal inguinal nodes palpated Neurologic: Grossly normal   Pelvic: External genitalia:  no lesions              Urethra:  normal appearing urethra with no masses, tenderness or lesions              Bartholin's and Skene's: normal                 Vagina:  normal appearing vagina with normal color and discharge, no lesions              Cervix: absent              Pap taken: No. Bimanual Exam:  Uterus:  uterus absent  Adnexa: no mass, fullness, tenderness               Rectovaginal: Confirms               Anus:  normal sphincter tone, no lesions  Chaperone present: no  A:  Well Woman with normal exam  S/P [facility]/ TAH for fibroids and abnormal pathology then had BSO 3 days later- spring 1999 on ERT Osteoporosis - followed by PCP - on Prolia     P:   Reviewed health and wellness pertinent to exam  Pap smear as above  Mammogram is due 3/18  Refill on Estrace 0.5 and may try to taper in the fall  Counseled with risk of CVA, DVT, cancer, etc  Counseled on breast self exam, mammography screening, use and side effects of HRT, adequate intake of calcium and vitamin D, diet and exercise return annually or prn  An After Visit Summary was printed and given to the patient.

## 2015-06-25 NOTE — Patient Instructions (Signed)

## 2015-06-26 LAB — HEPATITIS C ANTIBODY: HCV AB: NEGATIVE

## 2015-06-26 NOTE — Progress Notes (Signed)
Reviewed personally.  M. Suzanne Lynn Recendiz, MD.  

## 2015-08-14 ENCOUNTER — Other Ambulatory Visit: Payer: Self-pay | Admitting: Family Medicine

## 2015-08-14 MED ORDER — DENOSUMAB 60 MG/ML ~~LOC~~ SOLN: 60.0000 mg | Freq: Once | SUBCUTANEOUS | Status: DC

## 2015-08-14 NOTE — Telephone Encounter (Signed)
Sent to the pharmacy by e-scribe. 

## 2015-08-22 NOTE — Progress Notes (Signed)
Pre visit review using our clinic review tool, if applicable. No additional management support is needed unless otherwise documented below in the visit note.  Chief Complaint  Patient presents with  . Annual Exam    HPI: Patient  Yolanda Copeland  65 y.o. comes in today for [place] visit   Also fu ed from [place]  finger injury amputation tip  right 4 and 5 fingers    Sunday  July 23   rx with foam gauze and  Ring block  Since the aches  advil no antibiotic for now  But ring finger a bit more swollen than the other .     HT at goal at home. 134/84  LIPIDS NO SE   On prolia  Lowering estrogen per gyne   Hip issues bursitic vs pyriformis and seeing Dr Vista Lawman at [facility] [address] limiting exercise   allergies ok flonase  Sees Dr Toy Care yearly  No change in meds   ocass  Alprazolam .   Health Maintenance  Topic Date Due  . INFLUENZA VACCINE  08/27/2015  . MAMMOGRAM  04/21/2016  . TETANUS/TDAP  07/18/2023  . COLONOSCOPY  07/22/2024  . ZOSTAVAX  Completed  . Hepatitis C Screening  Completed  . HIV Screening  Completed   Health Maintenance Review LIFESTYLE:  Exercise:   Limited  Hip bvursitis  Tobacco/ETS:n Alcohol: ocass Sugar beverages: no Sleep: 8-10  Drug use: no HHof 2 plus dog but daughters family temp in house inbetween moving    ROS:  GEN/ HEENT: No fever, significant weight changes sweats headaches vision problems hearing changes, CV/ PULM; No chest pain shortness of breath cough, syncope,edema  change in exercise tolerance. GI /GU: No adominal pain, vomiting, change in bowel habits. No blood in the stool. No significant GU symptoms. SKIN/HEME: ,no acute skin rashes suspicious lesions or bleeding. No lymphadenopathy, nodules, masses.  NEURO/ PSYCH:  No neurologic signs such as weakness numbness. No depression anxiety. IMM/ Allergy: No unusual infections.  Allergy .   REST of 12 system review negative except as per HPI   Past Medical History:    Diagnosis Date  . Abnormal CXR   . Allergy   . Broken ankle 12/10   atraumatic fracture   . Cervicalgia   . Chicken pox   . Colon polyps    on 5 year recalldr Gamin   . COPD (chronic obstructive pulmonary disease) ([place])   . Depression   . Encounter for long-term (current) use of other medications   . GERD (gastroesophageal reflux disease)   . H/O amblyopia    hx patching left eye  . Hx of urinary stone    renal seen as incidental finding on x ray  . Hx of varicella   . Hyperlipemia   . Hypertension   . Migraine without aura   . Migraines   . Osteoporosis, unspecified   . Unspecified vitamin D deficiency     Past Surgical History:  Procedure Laterality Date  . ANKLE SURGERY  1997   plate fell   . APPENDECTOMY  1997  . BILATERAL SALPINGOOPHORECTOMY Bilateral spring 1999   3 days after [facility] pathology was suspicious and went back with [facility] and had BSO  . [place], 2003, 12/2013   x2; 2015 implants replaced  . SHOULDER SURGERY Right 09/15/2013  . TOTAL VAGINAL HYSTERECTOMY  spring 1999   menorrhagia; suspicious cells at cervix and fibroids   . VESICOVAGINAL FISTULA CLOSURE W/ [facility]  1997    Family History  Problem Relation Age of Onset  . Heart disease Father     died complications GB surgery perf and sepsis  . Hypertension Father   . Alcohol abuse Father   . Hyperlipidemia Father   . Dupuytren's contracture Father   . Diabetes Maternal Grandmother   . Hyperlipidemia Mother   . Dementia Mother     [place] dementia and Parkinson's  . Alcohol abuse Brother   . Hyperlipidemia Brother   . Hypertension Brother   . Diabetes Brother   . Dupuytren's contracture Brother   . Alcohol abuse Brother   . Hyperlipidemia Brother   . Cervical cancer Paternal Aunt     Cervical Cancer  . Heart disease Paternal Grandfather     Social History   Social History  . Marital status: Married    Spouse name: N/A  . Number of children: N/A  . Years of  education: N/A   Occupational History  . Retired    Social History Main Topics  . Smoking status: Former Smoker    Packs/day: 0.50    Types: Cigarettes    Quit date: 12/27/2007  . Smokeless tobacco: Never Used  . Alcohol use 1.2 - 1.8 oz/week    2 - 3 Glasses of wine per week  . Drug use: No  . Sexual activity: Yes    Partners: Male    Birth control/ protection: Surgical     Comment: Hysterectomy   Other Topics Concern  . None   Social History Narrative   6-9 hours of sleep per night   Lives with her husband  hh of 2    Has 1 dog and a few fish   Retired on 06/09/13   [place] [facility] [place]    \husband worked for [employer]   She worked [employer]   G5 G2   [place] no tob ogg for6 years onset age 72 years    Neg ets FA pos safely zx 3           Outpatient Medications Prior to Visit  Medication Sig Dispense Refill  . acetaminophen (TYLENOL) 500 MG tablet Take 500 mg by mouth every 6 (six) hours as needed.      . ALPRAZolam (XANAX) 1 MG tablet as needed.    Marland Kitchen aspirin 81 MG tablet Take 81 mg by mouth daily.    Marland Kitchen atorvastatin (LIPITOR) 20 MG tablet TAKE 1 TABLET (20 MG TOTAL) BY MOUTH DAILY. 90 tablet 0  . Calcium Carbonate (CALCIUM 600 PO) Take 1 tablet by mouth daily.    . Cholecalciferol (VITAMIN D3) 2000 UNITS TABS Once daily     . denosumab (PROLIA) 60 MG/ML SOLN injection Inject 60 mg into the skin once. Administer in upper arm, thigh, or abdomen 1 each 0  . estradiol (ESTRACE) 0.5 MG tablet TAKE 1 TABLET (0.5 MG TOTAL) BY MOUTH DAILY. 90 tablet 4  . famotidine (PEPCID) 10 MG tablet Take 10 mg by mouth 2 (two) times daily.    . fexofenadine (ALLEGRA) 180 MG tablet Take 180 mg by mouth daily.    . fluticasone (FLONASE) 50 MCG/ACT nasal spray Place 1 spray into both nostrils daily.    Marland Kitchen losartan (COZAAR) 50 MG tablet TAKE 1 TABLET (50 MG TOTAL) BY MOUTH DAILY. 90 tablet 2  . Multiple Vitamins-Minerals (CENTRUM SILVER PO) Once daily     . PRISTIQ 50 MG 24 hr tablet       . TRAZODONE HCL PO  Take 50 mg by mouth as needed.      No facility-administered medications prior to visit.      EXAM:  BP 122/90 (BP Location: Right Arm, Patient Position: Sitting, Cuff Size: Normal)   Temp 98.2 F (36.8 C) (Oral)   Ht '5\' 5"'  (1.651 m)   Wt 163 lb 12.8 oz (74.3 kg)   LMP 01/27/1995 (Approximate)   BMI 27.26 kg/m   Body mass index is 27.26 kg/m.  Physical Exam: Vital signs reviewed KKD:PTEL is a well-developed well-nourished alert cooperative    who appearsr stated age in no acute distress.  HEENT: normocephalic atraumatic , Eyes: PERRL EOM's full, conjunctiva clear, Nares: paten,t no deformity discharge or tenderness., Ears: no deformity EAC's clear TMs with normal landmarks. Mouth: clear OP, no lesions, edema.  Moist mucous membranes. Dentition in adequate repair. NECK: supple without masses, thyromegaly or bruits. CHEST/PULM:  Clear to auscultation and percussion breath sounds equal no wheeze , rales or rhonchi. No chest wall deformities or tenderness. CV: PMI is nondisplaced, S1 S2 no gallops, murmurs, rubs. Peripheral pulses are full without delay.No JVD . Breast: normal by inspection . No dimpling, discharge, masses, tenderness or discharge . ABDOMEN: Bowel sounds normal nontender  No guard or rebound, no hepato splenomegal no CVA tenderness.  No hernia. Extremtities:  No clubbing cyanosis or edema,  Right ring and pibnky sarpatted  Ring finger with tip avulsion laceration in place  With swelling edema and redness to kpipi   Bruising  ( from anesthesia injection ) somewhat numb at pulp nl cap refill  NEURO:  Oriented x3, cranial nerves 3-12 appear to be intact, no obvious focal weakness,gait within normal limits no abnormal reflexes or asymmetrical SKIN: No acute rashes normal turgor, color, no bruising or petechiae. PSYCH: Oriented, good eye contact, no obvious depression anxiety, cognition and judgment appear normal. LN: no cervical axillary inguinal  adenopathy    ASSESSMENT AND PLAN:  Discussed the following assessment and plan:  Visit for preventive health examination - Plan: Basic metabolic panel, CBC with Differential/Platelet, Hepatic function panel, Lipid panel, TSH, Hemoglobin A1c  Hyperlipidemia - medication - Plan: Basic metabolic panel, CBC with Differential/Platelet, Hepatic function panel, Lipid panel, TSH, Hemoglobin A1c  Hyperglycemia  Essential hypertension - reported at goal lab monitoring - Plan: Basic metabolic panel, CBC with Differential/Platelet, Hepatic function panel, Lipid panel, TSH, Hemoglobin A1c  Medication management - Plan: Basic metabolic panel, CBC with Differential/Platelet, Hepatic function panel, Lipid panel, TSH, Hemoglobin A1c  Finger infection - poss early to emergent care if alarm sx can check in 7-10 days if needed otherwose contninue wound care as planned  Finger laceration, sequela  Osteoporosis - on prolia   Patient Care Team: Burnis Medin, MD as PCP - General (Internal Medicine) Belva Chimes, MD as Referring Physician (Orthopedic Surgery) Massie Bougie, MD as Referring Physician Chucky May, MD as Consulting Physician (Psychiatry) Kem Boroughs, FNP as Nurse Practitioner (Gynecology) Patient Instructions  Continue lifestyle intervention healthy eating and exercise . Same meds   Add antibiotic  For right ring fiinger redness after  Injury .  Continue  Moist steril   Gentle cleansing and covering  If redness progresses  With pain or  Swelling up finger need to seek emergency care . Other wise may take a few weeks to be healed .  We can check it in  Another 7 - 10 days   bp goal below 140/90 contact us if not at goal  If all lab ok  then yearly check up     Health Maintenance, Female Adopting a healthy lifestyle and getting preventive care can go a long way to promote health and wellness. Talk with your health care provider about what schedule of regular examinations is right  for you. This is a good chance for you to check in with your provider about disease prevention and staying healthy. In between checkups, there are plenty of things you can do on your own. Experts have done a lot of research about which lifestyle changes and preventive measures are most likely to keep you healthy. Ask your health care provider for more information. WEIGHT AND DIET  Eat a healthy diet  Be sure to include plenty of vegetables, fruits, low-fat dairy products, and lean protein.  Do not eat a lot of foods high in solid fats, added sugars, or salt.  Get regular exercise. This is one of the most important things you can do for your health.  Most adults should exercise for at least 150 minutes each week. The exercise should increase your heart rate and make you sweat (moderate-intensity exercise).  Most adults should also do strengthening exercises at least twice a week. This is in addition to the moderate-intensity exercise.  Maintain a healthy weight  Body mass index (BMI) is a measurement that can be used to identify possible weight problems. It estimates body fat based on height and weight. Your health care provider can help determine your BMI and help you achieve or maintain a healthy weight.  For females 31 years of age and older:   A BMI below 18.5 is considered underweight.  A BMI of 18.5 to 24.9 is normal.  A BMI of 25 to 29.9 is considered overweight.  A BMI of 30 and above is considered obese.  Watch levels of cholesterol and blood lipids  You should start having your blood tested for lipids and cholesterol at 63 years of age, then have this test every 5 years.  You may need to have your cholesterol levels checked more often if:  Your lipid or cholesterol levels are high.  You are older than 63 years of age.  You are at high risk for heart disease.  CANCER SCREENING   Lung Cancer  Lung cancer screening is recommended for adults 6-35 years old who are  at high risk for lung cancer because of a history of smoking.  A yearly low-dose CT scan of the lungs is recommended for people who:  Currently smoke.  Have quit within the past 15 years.  Have at least a 30-pack-year history of smoking. A pack year is smoking an average of one pack of cigarettes a day for 1 year.  Yearly screening should continue until it has been 15 years since you quit.  Yearly screening should stop if you develop a health problem that would prevent you from having lung cancer treatment.  Breast Cancer  Practice breast self-awareness. This means understanding how your breasts normally appear and feel.  It also means doing regular breast self-exams. Let your health care provider know about any changes, no matter how small.  If you are in your 20s or 30s, you should have a clinical breast exam (CBE) by a health care provider every 1-3 years as part of a regular health exam.  If you are 25 or older, have a CBE every year. Also consider having a breast X-ray (mammogram) every year.  If you have a family history of breast cancer, talk to your  health care provider about genetic screening.  If you are at high risk for breast cancer, talk to your health care provider about having an MRI and a mammogram every year.  Breast cancer gene (BRCA) assessment is recommended for women who have family members with BRCA-related cancers. BRCA-related cancers include:  Breast.  Ovarian.  Tubal.  Peritoneal cancers.  Results of the assessment will determine the need for genetic counseling and BRCA1 and BRCA2 testing. Cervical Cancer Your health care provider may recommend that you be screened regularly for cancer of the pelvic organs (ovaries, uterus, and vagina). This screening involves a pelvic examination, including checking for microscopic changes to the surface of your cervix (Pap test). You may be encouraged to have this screening done every 3 years, beginning at age  70.  For women ages 41-65, health care providers may recommend pelvic exams and Pap testing every 3 years, or they may recommend the Pap and pelvic exam, combined with testing for human papilloma virus (HPV), every 5 years. Some types of HPV increase your risk of cervical cancer. Testing for HPV may also be done on women of any age with unclear Pap test results.  Other health care providers may not recommend any screening for nonpregnant women who are considered low risk for pelvic cancer and who do not have symptoms. Ask your health care provider if a screening pelvic exam is right for you.  If you have had past treatment for cervical cancer or a condition that could lead to cancer, you need Pap tests and screening for cancer for at least 20 years after your treatment. If Pap tests have been discontinued, your risk factors (such as having a new sexual partner) need to be reassessed to determine if screening should resume. Some women have medical problems that increase the chance of getting cervical cancer. In these cases, your health care provider may recommend more frequent screening and Pap tests. Colorectal Cancer  This type of cancer can be detected and often prevented.  Routine colorectal cancer screening usually begins at 63 years of age and continues through 63 years of age.  Your health care provider may recommend screening at an earlier age if you have risk factors for colon cancer.  Your health care provider may also recommend using home test kits to check for hidden blood in the stool.  A small camera at the end of a tube can be used to examine your colon directly (sigmoidoscopy or colonoscopy). This is done to check for the earliest forms of colorectal cancer.  Routine screening usually begins at age 16.  Direct examination of the colon should be repeated every 5-10 years through 63 years of age. However, you may need to be screened more often if early forms of precancerous polyps  or small growths are found. Skin Cancer  Check your skin from head to toe regularly.  Tell your health care provider about any new moles or changes in moles, especially if there is a change in a mole's shape or color.  Also tell your health care provider if you have a mole that is larger than the size of a pencil eraser.  Always use sunscreen. Apply sunscreen liberally and repeatedly throughout the day.  Protect yourself by wearing long sleeves, pants, a wide-brimmed hat, and sunglasses whenever you are outside. HEART DISEASE, DIABETES, AND HIGH BLOOD PRESSURE   High blood pressure causes heart disease and increases the risk of stroke. High blood pressure is more likely to develop in:  People who have blood pressure in the high end of the normal range (130-139/85-89 mm Hg).  People who are overweight or obese.  People who are African American.  If you are 44-76 years of age, have your blood pressure checked every 3-5 years. If you are 1 years of age or older, have your blood pressure checked every year. You should have your blood pressure measured twice--once when you are at a hospital or clinic, and once when you are not at a hospital or clinic. Record the average of the two measurements. To check your blood pressure when you are not at a hospital or clinic, you can use:  An automated blood pressure machine at a pharmacy.  A home blood pressure monitor.  If you are between 31 years and 58 years old, ask your health care provider if you should take aspirin to prevent strokes.  Have regular diabetes screenings. This involves taking a blood sample to check your fasting blood sugar level.  If you are at a normal weight and have a low risk for diabetes, have this test once every three years after 63 years of age.  If you are overweight and have a high risk for diabetes, consider being tested at a younger age or more often. PREVENTING INFECTION  Hepatitis B  If you have a higher  risk for hepatitis B, you should be screened for this virus. You are considered at high risk for hepatitis B if:  You were born in a country where hepatitis B is common. Ask your health care provider which countries are considered high risk.  Your parents were born in a high-risk country, and you have not been immunized against hepatitis B (hepatitis B vaccine).  You have HIV or AIDS.  You use needles to inject street drugs.  You live with someone who has hepatitis B.  You have had sex with someone who has hepatitis B.  You get hemodialysis treatment.  You take certain medicines for conditions, including cancer, organ transplantation, and autoimmune conditions. Hepatitis C  Blood testing is recommended for:  Everyone born from [birth year] through 1963.  Anyone with known risk factors for hepatitis C. Sexually transmitted infections (STIs)  You should be screened for sexually transmitted infections (STIs) including gonorrhea and chlamydia if:  You are sexually active and are younger than 63 years of age.  You are older than 63 years of age and your health care provider tells you that you are at risk for this type of infection.  Your sexual activity has changed since you were last screened and you are at an increased risk for chlamydia or gonorrhea. Ask your health care provider if you are at risk.  If you do not have HIV, but are at risk, it may be recommended that you take a prescription medicine daily to prevent HIV infection. This is called pre-exposure prophylaxis (PrEP). You are considered at risk if:  You are sexually active and do not regularly use condoms or know the HIV status of your partner(s).  You take drugs by injection.  You are sexually active with a partner who has HIV. Talk with your health care provider about whether you are at high risk of being infected with HIV. If you choose to begin PrEP, you should first be tested for HIV. You should then be tested every 3  months for as long as you are taking PrEP.  PREGNANCY   If you are premenopausal and you may become pregnant, ask your health  care provider about preconception counseling.  If you may become pregnant, take 400 to 800 micrograms (mcg) of folic acid every day.  If you want to prevent pregnancy, talk to your health care provider about birth control (contraception). OSTEOPOROSIS AND MENOPAUSE   Osteoporosis is a disease in which the bones lose minerals and strength with aging. This can result in serious bone fractures. Your risk for osteoporosis can be identified using a bone density scan.  If you are 25 years of age or older, or if you are at risk for osteoporosis and fractures, ask your health care provider if you should be screened.  Ask your health care provider whether you should take a calcium or vitamin D supplement to lower your risk for osteoporosis.  Menopause may have certain physical symptoms and risks.  Hormone replacement therapy may reduce some of these symptoms and risks. Talk to your health care provider about whether hormone replacement therapy is right for you.  HOME CARE INSTRUCTIONS   Schedule regular health, dental, and eye exams.  Stay current with your immunizations.   Do not use any tobacco products including cigarettes, chewing tobacco, or electronic cigarettes.  If you are pregnant, do not drink alcohol.  If you are breastfeeding, limit how much and how often you drink alcohol.  Limit alcohol intake to no more than 1 drink per day for nonpregnant women. One drink equals 12 ounces of beer, 5 ounces of wine, or 1 ounces of hard liquor.  Do not use street drugs.  Do not share needles.  Ask your health care provider for help if you need support or information about quitting drugs.  Tell your health care provider if you often feel depressed.  Tell your health care provider if you have ever been abused or do not feel safe at home.   This information is  not intended to replace advice given to you by your health care provider. Make sure you discuss any questions you have with your health care provider.   Document Released: 07/28/2010 Document Revised: 02/02/2014 Document Reviewed: 12/14/2012 Elsevier Interactive Patient Education 2016 [place] K. Sherrick Araki M.D.

## 2015-08-23 ENCOUNTER — Ambulatory Visit (INDEPENDENT_AMBULATORY_CARE_PROVIDER_SITE_OTHER): Payer: Federal, State, Local not specified - PPO | Admitting: Internal Medicine

## 2015-08-23 ENCOUNTER — Encounter: Payer: Self-pay | Admitting: Internal Medicine

## 2015-08-23 VITALS — BP 122/90 | Temp 98.2°F | Ht 65.0 in | Wt 163.8 lb

## 2015-08-23 DIAGNOSIS — R739 Hyperglycemia, unspecified: Secondary | ICD-10-CM | POA: Diagnosis not present

## 2015-08-23 DIAGNOSIS — I10 Essential (primary) hypertension: Secondary | ICD-10-CM

## 2015-08-23 DIAGNOSIS — S61219S Laceration without foreign body of unspecified finger without damage to nail, sequela: Secondary | ICD-10-CM

## 2015-08-23 DIAGNOSIS — M810 Age-related osteoporosis without current pathological fracture: Secondary | ICD-10-CM

## 2015-08-23 DIAGNOSIS — Z79899 Other long term (current) drug therapy: Secondary | ICD-10-CM | POA: Diagnosis not present

## 2015-08-23 DIAGNOSIS — Z0000 Encounter for general adult medical examination without abnormal findings: Secondary | ICD-10-CM

## 2015-08-23 DIAGNOSIS — L089 Local infection of the skin and subcutaneous tissue, unspecified: Secondary | ICD-10-CM

## 2015-08-23 DIAGNOSIS — E785 Hyperlipidemia, unspecified: Secondary | ICD-10-CM

## 2015-08-23 LAB — HEPATIC FUNCTION PANEL
ALBUMIN: 4.5 g/dL (ref 3.5–5.2)
ALT: 20 U/L (ref 0–35)
AST: 21 U/L (ref 0–37)
Alkaline Phosphatase: 69 U/L (ref 39–117)
BILIRUBIN TOTAL: 0.7 mg/dL (ref 0.2–1.2)
Bilirubin, Direct: 0.1 mg/dL (ref 0.0–0.3)
Total Protein: 7.1 g/dL (ref 6.0–8.3)

## 2015-08-23 LAB — BASIC METABOLIC PANEL
BUN: 11 mg/dL (ref 6–23)
CHLORIDE: 103 meq/L (ref 96–112)
CO2: 31 meq/L (ref 19–32)
Calcium: 9.9 mg/dL (ref 8.4–10.5)
Creatinine, Ser: 0.75 mg/dL (ref 0.40–1.20)
GFR: 82.98 mL/min (ref >60.00)
GLUCOSE: 132 mg/dL — AB (ref 70–99)
POTASSIUM: 4.2 meq/L (ref 3.5–5.1)
SODIUM: 139 meq/L (ref 135–145)

## 2015-08-23 LAB — TSH: TSH: 2.01 u[IU]/mL (ref 0.35–4.50)

## 2015-08-23 LAB — LIPID PANEL
CHOLESTEROL: 206 mg/dL — AB (ref 0–200)
HDL: 70.6 mg/dL (ref >39.00)
LDL CALC: 107 mg/dL — AB (ref 0–99)
NonHDL: 135.52
TRIGLYCERIDES: 141 mg/dL (ref 0.0–149.0)
Total CHOL/HDL Ratio: 3
VLDL: 28.2 mg/dL (ref 0.0–40.0)

## 2015-08-23 LAB — CBC WITH DIFFERENTIAL/PLATELET
BASOS PCT: 0.7 % (ref 0.0–3.0)
Basophils Absolute: 0.1 10*3/uL (ref 0.0–0.1)
EOS PCT: 1.8 % (ref 0.0–5.0)
Eosinophils Absolute: 0.1 10*3/uL (ref 0.0–0.7)
HCT: 43.9 % (ref 36.0–46.0)
Hemoglobin: 14.7 g/dL (ref 12.0–15.0)
LYMPHS ABS: 2.3 10*3/uL (ref 0.7–4.0)
Lymphocytes Relative: 30.9 % (ref 12.0–46.0)
MCHC: 33.5 g/dL (ref 30.0–36.0)
MCV: 89.5 fl (ref 78.0–100.0)
MONO ABS: 0.5 10*3/uL (ref 0.1–1.0)
MONOS PCT: 7.2 % (ref 3.0–12.0)
NEUTROS ABS: 4.4 10*3/uL (ref 1.4–7.7)
NEUTROS PCT: 59.4 % (ref 43.0–77.0)
PLATELETS: 290 10*3/uL (ref 150.0–400.0)
RBC: 4.91 Mil/uL (ref 3.87–5.11)
RDW: 12.6 % (ref 11.5–15.5)
WBC: 7.5 10*3/uL (ref 4.0–10.5)

## 2015-08-23 LAB — HEMOGLOBIN A1C: HEMOGLOBIN A1C: 5.5 % (ref 4.6–6.5)

## 2015-08-23 MED ORDER — AMOXICILLIN-POT CLAVULANATE 875-125 MG PO TABS: 1.0000 | ORAL_TABLET | Freq: Two times a day (BID) | ORAL | 0 refills | Status: DC

## 2015-08-23 NOTE — Patient Instructions (Addendum)
Continue lifestyle intervention healthy eating and exercise . Same meds   Add antibiotic  For right ring fiinger redness after  Injury .  Continue  Moist steril   Gentle cleansing and covering  If redness progresses  With pain or  Swelling up finger need to seek emergency care . Other wise may take a few weeks to be healed .  We can check it in  Another 7 - 10 days   bp goal below 140/90 contact us if not at goal  If all lab ok then yearly check up     Health Maintenance, Female Adopting a healthy lifestyle and getting preventive care can go a long way to promote health and wellness. Talk with your health care provider about what schedule of regular examinations is right for you. This is a good chance for you to check in with your provider about disease prevention and staying healthy. In between checkups, there are plenty of things you can do on your own. Experts have done a lot of research about which lifestyle changes and preventive measures are most likely to keep you healthy. Ask your health care provider for more information. WEIGHT AND DIET  Eat a healthy diet  Be sure to include plenty of vegetables, fruits, low-fat dairy products, and lean protein.  Do not eat a lot of foods high in solid fats, added sugars, or salt.  Get regular exercise. This is one of the most important things you can do for your health.  Most adults should exercise for at least 150 minutes each week. The exercise should increase your heart rate and make you sweat (moderate-intensity exercise).  Most adults should also do strengthening exercises at least twice a week. This is in addition to the moderate-intensity exercise.  Maintain a healthy weight  Body mass index (BMI) is a measurement that can be used to identify possible weight problems. It estimates body fat based on height and weight. Your health care provider can help determine your BMI and help you achieve or maintain a healthy weight.  For  females 11 years of age and older:   A BMI below 18.5 is considered underweight.  A BMI of 18.5 to 24.9 is normal.  A BMI of 25 to 29.9 is considered overweight.  A BMI of 30 and above is considered obese.  Watch levels of cholesterol and blood lipids  You should start having your blood tested for lipids and cholesterol at 63 years of age, then have this test every 5 years.  You may need to have your cholesterol levels checked more often if:  Your lipid or cholesterol levels are high.  You are older than 63 years of age.  You are at high risk for heart disease.  CANCER SCREENING   Lung Cancer  Lung cancer screening is recommended for adults 74-68 years old who are at high risk for lung cancer because of a history of smoking.  A yearly low-dose CT scan of the lungs is recommended for people who:  Currently smoke.  Have quit within the past 15 years.  Have at least a 30-pack-year history of smoking. A pack year is smoking an average of one pack of cigarettes a day for 1 year.  Yearly screening should continue until it has been 15 years since you quit.  Yearly screening should stop if you develop a health problem that would prevent you from having lung cancer treatment.  Breast Cancer  Practice breast self-awareness. This means understanding how your  breasts normally appear and feel.  It also means doing regular breast self-exams. Let your health care provider know about any changes, no matter how small.  If you are in your 20s or 30s, you should have a clinical breast exam (CBE) by a health care provider every 1-3 years as part of a regular health exam.  If you are 41 or older, have a CBE every year. Also consider having a breast X-ray (mammogram) every year.  If you have a family history of breast cancer, talk to your health care provider about genetic screening.  If you are at high risk for breast cancer, talk to your health care provider about having an MRI and  a mammogram every year.  Breast cancer gene (BRCA) assessment is recommended for women who have family members with BRCA-related cancers. BRCA-related cancers include:  Breast.  Ovarian.  Tubal.  Peritoneal cancers.  Results of the assessment will determine the need for genetic counseling and BRCA1 and BRCA2 testing. Cervical Cancer Your health care provider may recommend that you be screened regularly for cancer of the pelvic organs (ovaries, uterus, and vagina). This screening involves a pelvic examination, including checking for microscopic changes to the surface of your cervix (Pap test). You may be encouraged to have this screening done every 3 years, beginning at age 41.  For women ages 78-65, health care providers may recommend pelvic exams and Pap testing every 3 years, or they may recommend the Pap and pelvic exam, combined with testing for human papilloma virus (HPV), every 5 years. Some types of HPV increase your risk of cervical cancer. Testing for HPV may also be done on women of any age with unclear Pap test results.  Other health care providers may not recommend any screening for nonpregnant women who are considered low risk for pelvic cancer and who do not have symptoms. Ask your health care provider if a screening pelvic exam is right for you.  If you have had past treatment for cervical cancer or a condition that could lead to cancer, you need Pap tests and screening for cancer for at least 20 years after your treatment. If Pap tests have been discontinued, your risk factors (such as having a new sexual partner) need to be reassessed to determine if screening should resume. Some women have medical problems that increase the chance of getting cervical cancer. In these cases, your health care provider may recommend more frequent screening and Pap tests. Colorectal Cancer  This type of cancer can be detected and often prevented.  Routine colorectal cancer screening usually  begins at 62 years of age and continues through 63 years of age.  Your health care provider may recommend screening at an earlier age if you have risk factors for colon cancer.  Your health care provider may also recommend using home test kits to check for hidden blood in the stool.  A small camera at the end of a tube can be used to examine your colon directly (sigmoidoscopy or colonoscopy). This is done to check for the earliest forms of colorectal cancer.  Routine screening usually begins at age 47.  Direct examination of the colon should be repeated every 5-10 years through 63 years of age. However, you may need to be screened more often if early forms of precancerous polyps or small growths are found. Skin Cancer  Check your skin from head to toe regularly.  Tell your health care provider about any new moles or changes in moles, especially if  there is a change in a mole's shape or color.  Also tell your health care provider if you have a mole that is larger than the size of a pencil eraser.  Always use sunscreen. Apply sunscreen liberally and repeatedly throughout the day.  Protect yourself by wearing long sleeves, pants, a wide-brimmed hat, and sunglasses whenever you are outside. HEART DISEASE, DIABETES, AND HIGH BLOOD PRESSURE   High blood pressure causes heart disease and increases the risk of stroke. High blood pressure is more likely to develop in:  People who have blood pressure in the high end of the normal range (130-139/85-89 mm Hg).  People who are overweight or obese.  People who are African American.  If you are 17-29 years of age, have your blood pressure checked every 3-5 years. If you are 54 years of age or older, have your blood pressure checked every year. You should have your blood pressure measured twice--once when you are at a hospital or clinic, and once when you are not at a hospital or clinic. Record the average of the two measurements. To check your blood  pressure when you are not at a hospital or clinic, you can use:  An automated blood pressure machine at a pharmacy.  A home blood pressure monitor.  If you are between 79 years and 25 years old, ask your health care provider if you should take aspirin to prevent strokes.  Have regular diabetes screenings. This involves taking a blood sample to check your fasting blood sugar level.  If you are at a normal weight and have a low risk for diabetes, have this test once every three years after 63 years of age.  If you are overweight and have a high risk for diabetes, consider being tested at a younger age or more often. PREVENTING INFECTION  Hepatitis B  If you have a higher risk for hepatitis B, you should be screened for this virus. You are considered at high risk for hepatitis B if:  You were born in a country where hepatitis B is common. Ask your health care provider which countries are considered high risk.  Your parents were born in a high-risk country, and you have not been immunized against hepatitis B (hepatitis B vaccine).  You have HIV or AIDS.  You use needles to inject street drugs.  You live with someone who has hepatitis B.  You have had sex with someone who has hepatitis B.  You get hemodialysis treatment.  You take certain medicines for conditions, including cancer, organ transplantation, and autoimmune conditions. Hepatitis C  Blood testing is recommended for:  Everyone born from [birth year] through 1963.  Anyone with known risk factors for hepatitis C. Sexually transmitted infections (STIs)  You should be screened for sexually transmitted infections (STIs) including gonorrhea and chlamydia if:  You are sexually active and are younger than 63 years of age.  You are older than 63 years of age and your health care provider tells you that you are at risk for this type of infection.  Your sexual activity has changed since you were last screened and you are at an  increased risk for chlamydia or gonorrhea. Ask your health care provider if you are at risk.  If you do not have HIV, but are at risk, it may be recommended that you take a prescription medicine daily to prevent HIV infection. This is called pre-exposure prophylaxis (PrEP). You are considered at risk if:  You are sexually active and do  not regularly use condoms or know the HIV status of your partner(s).  You take drugs by injection.  You are sexually active with a partner who has HIV. Talk with your health care provider about whether you are at high risk of being infected with HIV. If you choose to begin PrEP, you should first be tested for HIV. You should then be tested every 3 months for as long as you are taking PrEP.  PREGNANCY   If you are premenopausal and you may become pregnant, ask your health care provider about preconception counseling.  If you may become pregnant, take 400 to 800 micrograms (mcg) of folic acid every day.  If you want to prevent pregnancy, talk to your health care provider about birth control (contraception). OSTEOPOROSIS AND MENOPAUSE   Osteoporosis is a disease in which the bones lose minerals and strength with aging. This can result in serious bone fractures. Your risk for osteoporosis can be identified using a bone density scan.  If you are 36 years of age or older, or if you are at risk for osteoporosis and fractures, ask your health care provider if you should be screened.  Ask your health care provider whether you should take a calcium or vitamin D supplement to lower your risk for osteoporosis.  Menopause may have certain physical symptoms and risks.  Hormone replacement therapy may reduce some of these symptoms and risks. Talk to your health care provider about whether hormone replacement therapy is right for you.  HOME CARE INSTRUCTIONS   Schedule regular health, dental, and eye exams.  Stay current with your immunizations.   Do not use any  tobacco products including cigarettes, chewing tobacco, or electronic cigarettes.  If you are pregnant, do not drink alcohol.  If you are breastfeeding, limit how much and how often you drink alcohol.  Limit alcohol intake to no more than 1 drink per day for nonpregnant women. One drink equals 12 ounces of beer, 5 ounces of wine, or 1 ounces of hard liquor.  Do not use street drugs.  Do not share needles.  Ask your health care provider for help if you need support or information about quitting drugs.  Tell your health care provider if you often feel depressed.  Tell your health care provider if you have ever been abused or do not feel safe at home.   This information is not intended to replace advice given to you by your health care provider. Make sure you discuss any questions you have with your health care provider.   Document Released: 07/28/2010 Document Revised: 02/02/2014 Document Reviewed: 12/14/2012 Elsevier Interactive Patient Education [employer].

## 2015-08-26 ENCOUNTER — Other Ambulatory Visit: Payer: Self-pay | Admitting: Internal Medicine

## 2015-08-26 NOTE — Telephone Encounter (Signed)
Refilled for 1 year per [facility]

## 2015-08-29 ENCOUNTER — Telehealth: Payer: Self-pay | Admitting: Internal Medicine

## 2015-08-29 NOTE — Telephone Encounter (Signed)
Spoke to the pt.  Advised OTC Monistat for 3-7 days and if not helping than call back.

## 2015-08-29 NOTE — Telephone Encounter (Signed)
Spoke to the pt.  She is currently having vaginal soreness and redness.  No fever or abdominal pain.  Having some thin discharge.  Pt expects the discharge to thicken and become white.  Please advise.  Thanks!!!

## 2015-08-29 NOTE — Telephone Encounter (Signed)
Pt call to say she has a yeast infection from taking the antibiotic and is asking if something can be called into The below pharmacy    Pharmacy; [facility] [address]

## 2015-08-29 NOTE — Telephone Encounter (Signed)
Advise OTC monistat 3- 7 days    But if not helping  Can also add   diflucan 150 x 1  Po

## 2015-09-03 ENCOUNTER — Other Ambulatory Visit: Payer: Self-pay | Admitting: Internal Medicine

## 2015-09-03 NOTE — Telephone Encounter (Signed)
Sent to the pharmacy by e-scribe. 

## 2015-09-16 DIAGNOSIS — H53022 Refractive amblyopia, left eye: Secondary | ICD-10-CM | POA: Insufficient documentation

## 2015-09-16 DIAGNOSIS — H524 Presbyopia: Secondary | ICD-10-CM | POA: Insufficient documentation

## 2015-09-18 ENCOUNTER — Telehealth: Payer: Self-pay | Admitting: Internal Medicine

## 2015-09-18 NOTE — Telephone Encounter (Signed)
Pt received letter [facility] stating if she is to continue  prolia injection, she will need a prior authorization  Pt will be due next injection in October.Yolanda Copeland  [phone]

## 2015-09-23 NOTE — Telephone Encounter (Signed)
Will we need to do PA or enter pt into new program?  Please advise.  Thanks!!

## 2015-09-25 NOTE — Telephone Encounter (Signed)
[facility] patient. Please provide guidance on her status with insurance

## 2015-09-26 NOTE — Telephone Encounter (Signed)
I have her on the list for October. I am working with Joycelyn Schmid to get her paperwork completed and find out the status of how much she will have to pay if at all. I am hoping to have this done by the end of next week.

## 2015-10-08 ENCOUNTER — Other Ambulatory Visit: Payer: No Typology Code available for payment source

## 2015-10-15 ENCOUNTER — Encounter: Payer: No Typology Code available for payment source | Admitting: Internal Medicine

## 2015-11-04 ENCOUNTER — Telehealth: Payer: Self-pay

## 2015-11-04 NOTE — Telephone Encounter (Signed)
Received PA request for Prolia injection via form from insurance company. Form filled out & faxed back to insurance company.

## 2015-11-06 NOTE — Telephone Encounter (Signed)
PA for Prolia is approved.

## 2015-11-12 ENCOUNTER — Ambulatory Visit (INDEPENDENT_AMBULATORY_CARE_PROVIDER_SITE_OTHER): Payer: Federal, State, Local not specified - PPO | Admitting: Family Medicine

## 2015-11-12 DIAGNOSIS — M810 Age-related osteoporosis without current pathological fracture: Secondary | ICD-10-CM | POA: Diagnosis not present

## 2015-11-12 MED ORDER — DENOSUMAB 60 MG/ML ~~LOC~~ SOLN
60.0000 mg | Freq: Once | SUBCUTANEOUS | Status: AC
  Administered 2015-11-12: 60 mg via SUBCUTANEOUS

## 2016-03-09 NOTE — Progress Notes (Signed)
Pre visit review using our clinic review tool, if applicable. No additional management support is needed unless otherwise documented below in the visit note.  Chief Complaint  Patient presents with  . Nevus    HPI: Yolanda Copeland 66 y.o.  sda problem  She has had a mole like lesion on the left thorax for a while but has been growing over the last 6 months it is now gotten caught in her close and is now sore and irritated. Needed to be checked. She has a Band-Aid on it because it was bleeding. ROS: See pertinent positives and negatives per HPI.  Past Medical History:  Diagnosis Date  . Abnormal CXR   . Allergy   . Broken ankle 12/10   atraumatic fracture   . Cervicalgia   . Chicken pox   . Colon polyps    on 5 year recalldr Gamin   . COPD (chronic obstructive pulmonary disease) ([place])   . Depression   . Encounter for long-term (current) use of other medications   . GERD (gastroesophageal reflux disease)   . H/O amblyopia    hx patching left eye  . Hx of urinary stone    renal seen as incidental finding on x ray  . Hx of varicella   . Hyperlipemia   . Hypertension   . Migraine without aura   . Migraines   . Osteoporosis, unspecified   . Unspecified vitamin D deficiency     Family History  Problem Relation Age of Onset  . Heart disease Father     died complications GB surgery perf and sepsis  . Hypertension Father   . Alcohol abuse Father   . Hyperlipidemia Father   . Dupuytren's contracture Father   . Diabetes Maternal Grandmother   . Hyperlipidemia Mother   . Dementia Mother     [place] dementia and Parkinson's  . Alcohol abuse Brother   . Hyperlipidemia Brother   . Hypertension Brother   . Diabetes Brother   . Dupuytren's contracture Brother   . Alcohol abuse Brother   . Hyperlipidemia Brother   . Cervical cancer Paternal Aunt     Cervical Cancer  . Heart disease Paternal Grandfather     Social History   Social History  . Marital status: Married      Spouse name: N/A  . Number of children: N/A  . Years of education: N/A   Occupational History  . Retired    Social History Main Topics  . Smoking status: Former Smoker    Packs/day: 0.50    Types: Cigarettes    Quit date: 12/27/2007  . Smokeless tobacco: Never Used  . Alcohol use 1.2 - 1.8 oz/week    2 - 3 Glasses of wine per week  . Drug use: No  . Sexual activity: Yes    Partners: Male    Birth control/ protection: Surgical     Comment: Hysterectomy   Other Topics Concern  . 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 acetaminophen (TYLENOL) 500 MG tablet Take 500 mg by mouth every 6 (six) hours as needed.      . ALPRAZolam (XANAX) 1 MG tablet as needed.    Marland Kitchen aspirin 81 MG tablet Take 81 mg by mouth daily.    Marland Kitchen atorvastatin (LIPITOR) 20 MG tablet TAKE 1 TABLET (20 MG TOTAL) BY MOUTH DAILY. 90 tablet 2  . Calcium Carbonate (CALCIUM 600 PO) Take 1 tablet by mouth daily.    . Cholecalciferol (VITAMIN D3) 2000 UNITS TABS Once daily     . denosumab (PROLIA) 60 MG/ML SOLN injection Inject 60 mg into the skin once. Administer in upper arm, thigh, or abdomen 1 each 0  . famotidine (PEPCID) 10 MG tablet Take 10 mg by mouth 2 (two) times daily.    . fexofenadine (ALLEGRA) 180 MG tablet Take 180 mg by mouth daily.    . fluticasone (FLONASE) 50 MCG/ACT nasal spray Place 1 spray into both nostrils daily.    Marland Kitchen losartan (COZAAR) 50 MG tablet TAKE 1 TABLET (50 MG TOTAL) BY MOUTH DAILY. 90 tablet 3  . Multiple Vitamins-Minerals (CENTRUM SILVER PO) Once daily     . PRISTIQ 50 MG 24 hr tablet     . TRAZODONE HCL PO Take 50 mg by  mouth as needed.     Marland Kitchen amoxicillin-clavulanate (AUGMENTIN) 875-125 MG tablet Take 1 tablet by mouth every 12 (twelve) hours. 20 tablet 0  . estradiol (ESTRACE) 0.5 MG tablet TAKE 1 TABLET (0.5 MG TOTAL) BY MOUTH DAILY. 90 tablet 4   No facility-administered medications prior to visit.      EXAM:  BP (!) 170/90 (BP Location: Right Arm, Patient Position: Sitting, Cuff Size: Normal)   Temp 98.1 F (36.7 C)   Wt 166 lb (75.3 kg)   LMP 01/27/1995 (Approximate)   BMI 27.62 kg/m   Body mass index is 27.62 kg/m.  GENERAL: vitals reviewed and listed above, alert, oriented, appears well hydrated and in no acute distress HEENT: atraumatic, conjunctiva  clear, no obvious abnormalities on inspection of external nose and ears Examination of her back shows a 6 -8 mm lesion that looks like an FK ribs from the base with a small amount of blood in a stock remaining. It is very tender but not swollen.   PSYCH: pleasant and cooperative, no obvious depression or anxiety  Discussion about removal. Patient agrees. 1 mL of 2% with epi infiltrated under aseptic conditions Betadine prep. Lesion removed with sharp [place] ,base appears clear. Hemostasis minimal bleeding bruising. We dressed with antibiotic ointment and covered. Lesion sent for pathology because of context. ASSESSMENT AND PLAN:  Discussed the following assessment and plan:  Painful skin lesion - Possible partially avulsed seborrheic keratosis with bleeding removed today see text - Plan: Dermatology pathology  Essential hypertension Blood pressure is up discussed making sure it is in the right range when not in the office. Expectant management local care. -Patient advised to return or notify health care team  if symptoms worsen ,persist or new concerns arise.  Patient Instructions  This is probably a seborrheic keratosis but will let you know 1 pathology is back. Keep the area treated like a scrape gentle cleaning antibiotic ointment and  cover until healed. Contact us if you have any signs of infection. Sometimes these growths can grow back but not for quite a while. Your blood pressure is up today but on the surmise it is from  the reason you're here. Suggest repeating blood pressure when you are well and relaxed.    Standley Brooking. Kyndal Gloster M.D.

## 2016-03-10 ENCOUNTER — Encounter: Payer: Self-pay | Admitting: Internal Medicine

## 2016-03-10 ENCOUNTER — Ambulatory Visit (INDEPENDENT_AMBULATORY_CARE_PROVIDER_SITE_OTHER): Payer: Federal, State, Local not specified - PPO | Admitting: Internal Medicine

## 2016-03-10 VITALS — BP 170/90 | Temp 98.1°F | Wt 166.0 lb

## 2016-03-10 DIAGNOSIS — I10 Essential (primary) hypertension: Secondary | ICD-10-CM

## 2016-03-10 DIAGNOSIS — L989 Disorder of the skin and subcutaneous tissue, unspecified: Secondary | ICD-10-CM | POA: Diagnosis not present

## 2016-03-10 NOTE — Patient Instructions (Signed)
This is probably a seborrheic keratosis but will let you know 1 pathology is back. Keep the area treated like a scrape gentle cleaning antibiotic ointment and cover until healed. Contact us if you have any signs of infection. Sometimes these growths can grow back but not for quite a while. Your blood pressure is up today but on the surmise it is from the reason you're here. Suggest repeating blood pressure when you are well and relaxed.

## 2016-05-05 ENCOUNTER — Other Ambulatory Visit: Payer: Self-pay | Admitting: Internal Medicine

## 2016-05-05 DIAGNOSIS — Z1231 Encounter for screening mammogram for malignant neoplasm of breast: Secondary | ICD-10-CM

## 2016-05-12 ENCOUNTER — Telehealth: Payer: Self-pay | Admitting: Internal Medicine

## 2016-05-12 MED ORDER — DENOSUMAB 60 MG/ML ~~LOC~~ SOLN: 60.0000 mg | Freq: Once | SUBCUTANEOUS | 0 refills | Status: AC

## 2016-05-12 NOTE — Telephone Encounter (Signed)
Pt request refill  denosumab (PROLIA) 60 MG/ML SOLN injection  Pt has a new pharmacy and needs sent to  [facility] [place]

## 2016-05-12 NOTE — Telephone Encounter (Signed)
rx sent into new pharmacy.   

## 2016-05-20 ENCOUNTER — Telehealth: Payer: Self-pay

## 2016-05-20 ENCOUNTER — Telehealth: Payer: Self-pay | Admitting: Internal Medicine

## 2016-05-20 NOTE — Telephone Encounter (Signed)
Received a call from [facility] that they will be shipping out the patient Prolia injection. They will be providing her Prolia injections and shipping them here to the office for [place] to administer. They have already spoken to patient regarding her cost. I am calling patient to schedule her injection.

## 2016-05-20 NOTE — Telephone Encounter (Signed)
error 

## 2016-05-25 ENCOUNTER — Ambulatory Visit
Admission: RE | Admit: 2016-05-25 | Discharge: 2016-05-25 | Disposition: A | Discharge status: Home / Self Care | Payer: Federal, State, Local not specified - PPO | Source: Ambulatory Visit | Attending: Internal Medicine | Admitting: Internal Medicine

## 2016-05-25 ENCOUNTER — Ambulatory Visit: Payer: Federal, State, Local not specified - PPO

## 2016-05-25 DIAGNOSIS — Z1231 Encounter for screening mammogram for malignant neoplasm of breast: Secondary | ICD-10-CM

## 2016-05-26 ENCOUNTER — Ambulatory Visit (INDEPENDENT_AMBULATORY_CARE_PROVIDER_SITE_OTHER): Payer: Federal, State, Local not specified - PPO

## 2016-05-26 DIAGNOSIS — M810 Age-related osteoporosis without current pathological fracture: Secondary | ICD-10-CM

## 2016-05-26 MED ORDER — DENOSUMAB 60 MG/ML ~~LOC~~ SOLN
60.0000 mg | Freq: Once | SUBCUTANEOUS | Status: AC
  Administered 2016-05-26: 60 mg via SUBCUTANEOUS

## 2016-07-06 ENCOUNTER — Ambulatory Visit: Payer: Federal, State, Local not specified - PPO | Admitting: Nurse Practitioner

## 2016-07-28 NOTE — Progress Notes (Signed)
Chief Complaint  Patient presents with  . Diarrhea    Ongoing for 1 month.  Has tried Peptol Bismol and Imodium.    HPI: Yolanda Copeland 66 y.o.     Problem based visit   Onset about 1 month ago of  Diarrhea watery to some thikended   5+ per day 24 hours  Related to eating  butg can cause awakenings    No fever weight loss .  No cramps, pain ,blood .    Min  well water.    Bottled water.   Had before traveled to [facility]  No others sick no new meds or   Supplements   immodium causes some  Backing up and then pepto dark stools no help . No vomiting fever weight loss  ROS: See pertinent positives and negatives per HPI. No recent antibiotic  Change in diet   Last colon [facility].  Some caffiene ocass etoh  Past Medical History:  Diagnosis Date  . Abnormal CXR   . Allergy   . Broken ankle 12/10   atraumatic fracture   . Cervicalgia   . Chicken pox   . Colon polyps    on 5 year recalldr Gamin   . COPD (chronic obstructive pulmonary disease) ([place])   . Depression   . Encounter for long-term (current) use of other medications   . GERD (gastroesophageal reflux disease)   . H/O amblyopia    hx patching left eye  . Hx of urinary stone    renal seen as incidental finding on x ray  . Hx of varicella   . Hyperlipemia   . Hypertension   . Migraine without aura   . Migraines   . Osteoporosis, unspecified   . Unspecified vitamin D deficiency     Family History  Problem Relation Age of Onset  . Heart disease Father        died complications GB surgery perf and sepsis  . Hypertension Father   . Alcohol abuse Father   . Hyperlipidemia Father   . Dupuytren's contracture Father   . Diabetes Maternal Grandmother   . Hyperlipidemia Mother   . Dementia Mother        Peabody dementia and Parkinson's  . Alcohol abuse Brother   . Hyperlipidemia Brother   . Hypertension Brother   . Diabetes Brother   . Dupuytren's contracture Brother   . Alcohol abuse Brother   . Hyperlipidemia  Brother   . Heart disease Paternal Grandfather   . Cervical cancer Paternal Aunt        Cervical Cancer    Social History   Social History  . Marital status: Married    Spouse name: N/A  . Number of children: N/A  . Years of education: N/A   Occupational History  . Retired    Social History Main Topics  . Smoking status: Former Smoker    Packs/day: 0.50    Types: Cigarettes    Quit date: 12/27/2007  . Smokeless tobacco: Never Used  . Alcohol use 1.2 - 1.8 oz/week    2 - 3 Glasses of wine per week  . Drug use: No  . Sexual activity: Yes    Partners: Male    Birth control/ protection: Surgical     Comment: Hysterectomy   Other Topics Concern  . None   Social History Narrative   6-9 hours of sleep per night   Lives with her husband  hh of 2    Has  1 dog and a few fish   Retired on 06/09/13   [place] [facility] [place]    \husband worked for [employer]   She worked ciommunications   G5 G2   [place] no tob ogg for6 years onset age 61 years    Neg ets FA pos safely zx 3           Outpatient Medications Prior to Visit  Medication Sig Dispense Refill  . acetaminophen (TYLENOL) 500 MG tablet Take 500 mg by mouth every 6 (six) hours as needed.      . ALPRAZolam (XANAX) 1 MG tablet as needed.    Marland Kitchen aspirin 81 MG tablet Take 81 mg by mouth daily.    Marland Kitchen atorvastatin (LIPITOR) 20 MG tablet TAKE 1 TABLET (20 MG TOTAL) BY MOUTH DAILY. 90 tablet 2  . Calcium Carbonate (CALCIUM 600 PO) Take 1 tablet by mouth daily.    . Cholecalciferol (VITAMIN D3) 2000 UNITS TABS Once daily     . famotidine (PEPCID) 10 MG tablet Take 10 mg by mouth 2 (two) times daily.    . fexofenadine (ALLEGRA) 180 MG tablet Take 180 mg by mouth daily.    . fluticasone (FLONASE) 50 MCG/ACT nasal spray Place 1 spray into both nostrils daily.    Marland Kitchen losartan (COZAAR) 50 MG tablet TAKE 1 TABLET (50 MG TOTAL) BY MOUTH DAILY. 90 tablet 3  . Multiple Vitamins-Minerals (CENTRUM SILVER PO) Once daily     . PRISTIQ 50 MG 24 hr  tablet     . TRAZODONE HCL PO Take 50 mg by mouth as needed.      No facility-administered medications prior to visit.      EXAM:  BP (!) 150/80 (BP Location: Other (Comment)) Comment (BP Location): Both arm checked and the same  Temp 98.1 F (36.7 C) (Oral)   Wt 162 lb (73.5 kg)   LMP 01/27/1995 (Approximate)   BMI 26.96 kg/m   Body mass index is 26.96 kg/m.  GENERAL: vitals reviewed and listed above, alert, oriented, appears well hydrated and in no acute distress HEENT: atraumatic, conjunctiva  clear, no obvious abnormalities on inspection of external nose and ears NECK: no obvious masses on inspection palpation  LUNGS: clear to auscultation bilaterally, no wheezes, rales or rhonchi, good air movement CV: HRRR, no clubbing cyanosis or  peripheral edema nl cap refill  Abdomen:  Sof,t normal bowel sounds without hepatosplenomegaly, no guarding rebound or masses no CVA tenderness MS: moves all extremities without noticeable focal  Abnormality Skin: normal capillary refill ,turgor , color: No acute rashes ,petechiae or bruising PSYCH: pleasant and cooperative, no obvious depression or anxiety   ASSESSMENT AND PLAN:  Discussed the following assessment and plan:  Diarrhea, unspecified type - 1 mos no systemic sx see text  - Plan: Stool culture, Fecal lactoferrin, quant, Giardia/cryptosporidium (EIA), POC Hemoccult Bld/Stl (3-Cd Home Screen), CBC with Differential/Platelet, Comprehensive metabolic panel, Sedimentation rate Uncertain cause without significant systemic symptoms but ongoing for a month. This is a change in bowel habits will check for infection but could be a lymphocytic colitis or such. No other alarm findings. Plan laboratory tests and stool tests today and if unrevealing or treatable and continuing plan her GYN referral to see her doctor at [facility]. Dr. Penelope Coop. Patient is aware to get back with [place] if any alarm features. She can try probiotics at this time. -Patient  advised to return or notify health care team  if symptoms worsen ,persist or new concerns arise.  Patient Instructions  Ordering stool tests lab today  to help delineate cause of diarrhea Can use Imodium just for comfort travel. You can try a probiotic such as ffluorastor  , culturelle but it may not make a difference.       Standley Brooking. Michelangelo Rindfleisch M.D.

## 2016-07-31 ENCOUNTER — Ambulatory Visit (INDEPENDENT_AMBULATORY_CARE_PROVIDER_SITE_OTHER): Payer: Federal, State, Local not specified - PPO | Admitting: Internal Medicine

## 2016-07-31 ENCOUNTER — Encounter: Payer: Self-pay | Admitting: Internal Medicine

## 2016-07-31 VITALS — BP 150/80 | Temp 98.1°F | Wt 162.0 lb

## 2016-07-31 DIAGNOSIS — R197 Diarrhea, unspecified: Secondary | ICD-10-CM | POA: Diagnosis not present

## 2016-07-31 LAB — CBC WITH DIFFERENTIAL/PLATELET
Basophils Absolute: 0 10*3/uL (ref 0.0–0.1)
Basophils Relative: 0.7 % (ref 0.0–3.0)
EOS PCT: 0.3 % (ref 0.0–5.0)
Eosinophils Absolute: 0 10*3/uL (ref 0.0–0.7)
HCT: 40 % (ref 36.0–46.0)
Hemoglobin: 13.6 g/dL (ref 12.0–15.0)
LYMPHS ABS: 2.3 10*3/uL (ref 0.7–4.0)
Lymphocytes Relative: 36 % (ref 12.0–46.0)
MCHC: 34 g/dL (ref 30.0–36.0)
MCV: 89 fl (ref 78.0–100.0)
MONO ABS: 0.7 10*3/uL (ref 0.1–1.0)
MONOS PCT: 11.1 % (ref 3.0–12.0)
NEUTROS PCT: 51.9 % (ref 43.0–77.0)
Neutro Abs: 3.3 10*3/uL (ref 1.4–7.7)
Platelets: 303 10*3/uL (ref 150.0–400.0)
RBC: 4.49 Mil/uL (ref 3.87–5.11)
RDW: 12.8 % (ref 11.5–15.5)
WBC: 6.4 10*3/uL (ref 4.0–10.5)

## 2016-07-31 LAB — COMPREHENSIVE METABOLIC PANEL
ALK PHOS: 72 U/L (ref 39–117)
ALT: 20 U/L (ref 0–35)
AST: 19 U/L (ref 0–37)
Albumin: 4.3 g/dL (ref 3.5–5.2)
BUN: 15 mg/dL (ref 6–23)
CHLORIDE: 105 meq/L (ref 96–112)
CO2: 30 mEq/L (ref 19–32)
Calcium: 9.3 mg/dL (ref 8.4–10.5)
Creatinine, Ser: 0.78 mg/dL (ref 0.40–1.20)
GFR: 79.07 mL/min (ref >60.00)
GLUCOSE: 93 mg/dL (ref 70–99)
POTASSIUM: 4.3 meq/L (ref 3.5–5.1)
SODIUM: 141 meq/L (ref 135–145)
TOTAL PROTEIN: 6.7 g/dL (ref 6.0–8.3)
Total Bilirubin: 0.4 mg/dL (ref 0.2–1.2)

## 2016-07-31 LAB — SEDIMENTATION RATE: SED RATE: 6 mm/h (ref 0–30)

## 2016-07-31 NOTE — Patient Instructions (Signed)
Ordering stool tests lab today  to help delineate cause of diarrhea Can use Imodium just for comfort travel. You can try a probiotic such as ffluorastor  , culturelle but it may not make a difference.

## 2016-08-04 LAB — FECAL LACTOFERRIN, QUANT: Lactoferrin: POSITIVE

## 2016-08-06 ENCOUNTER — Other Ambulatory Visit (INDEPENDENT_AMBULATORY_CARE_PROVIDER_SITE_OTHER): Payer: Federal, State, Local not specified - PPO

## 2016-08-06 DIAGNOSIS — R197 Diarrhea, unspecified: Secondary | ICD-10-CM

## 2016-08-06 LAB — POC HEMOCCULT BLD/STL (HOME/3-CARD/SCREEN)
Card #3 Fecal Occult Blood, POC: NEGATIVE
FECAL OCCULT BLD: NEGATIVE
FECAL OCCULT BLD: NEGATIVE

## 2016-08-07 LAB — STOOL CULTURE

## 2016-08-10 LAB — GIARDIA/CRYPTOSPORIDIUM (EIA)

## 2016-08-17 ENCOUNTER — Other Ambulatory Visit: Payer: Self-pay | Admitting: Internal Medicine

## 2016-08-21 NOTE — Progress Notes (Signed)
Chief Complaint  Patient presents with  . Annual Exam    HPI: Patient  Yolanda Copeland  66 y.o. comes in today for Preventive Health Care visit  And med management  Sees dr Toy Care fo pristiqu doing well rare [place] of trazadone or alprazolam   LIPIDS:  No se of med taking   HT bp usually at goal but rising at times up and dow 120/80 and up  No se of med  GI sx resolved see last visit .  Bone health on prolia  No se noted   Last dexa ? 3 years  Taking vit d  2000  Allergy stable   Health Maintenance  Topic Date Due  . INFLUENZA VACCINE  08/26/2016  . MAMMOGRAM  05/25/2017  . TETANUS/TDAP  07/18/2023  . COLONOSCOPY  07/22/2024  . Hepatitis C Screening  Completed  . HIV Screening  Completed   Health Maintenance Review LIFESTYLE:  Exercise:  some Tobacco/ETS:no Alcohol: ocass Sugar beverages: diet and homey in tea Sleep: 7-9 hours  Drug use: no HH of 2 dog Work/volunteer hours /week:[employer] 1.5 per week    ROS:  GEN/ HEENT: No fever, significant weight changes sweats headaches vision problems hearing changes, CV/ PULM; No chest pain shortness of breath cough, syncope,edema  change in exercise tolerance. GI /GU: No adominal pain, vomiting, change in bowel habits. No blood in the stool. No significant GU symptoms. SKIN/HEME: ,no acute skin rashes suspicious lesions or bleeding. No lymphadenopathy, nodules, masses.  NEURO/ PSYCH:  No neurologic signs such as weakness numbness. No depression anxiety. IMM/ Allergy: No unusual infections.  Allergy .   REST of 12 system review negative except as per HPI   Past Medical History:  Diagnosis Date  . Abnormal CXR   . Allergy   . Broken ankle 12/10   atraumatic fracture   . Cervicalgia   . Chicken pox   . Colon polyps    on 5 year recalldr Gamin   . COPD (chronic obstructive pulmonary disease) ([place])   . Depression   . Encounter for long-term (current) use of other medications   . GERD (gastroesophageal reflux  disease)   . H/O amblyopia    hx patching left eye  . Hx of urinary stone    renal seen as incidental finding on x ray  . Hx of varicella   . Hyperlipemia   . Hypertension   . Migraine without aura   . Migraines   . Osteoporosis, unspecified   . Unspecified vitamin D deficiency     Past Surgical History:  Procedure Laterality Date  . ANKLE SURGERY  1997   plate fell   . APPENDECTOMY  1997  . AUGMENTATION MAMMAPLASTY  01/15/2014  . BILATERAL SALPINGOOPHORECTOMY Bilateral spring 1999   3 days after [facility] pathology was suspicious and went back with [facility] and had BSO  . [place], 2003, 12/2013   x2; 2015 implants replaced  . SHOULDER SURGERY Right 09/15/2013  . TOTAL VAGINAL HYSTERECTOMY  spring 1999   menorrhagia; suspicious cells at cervix and fibroids   . VESICOVAGINAL FISTULA CLOSURE W/ [facility]  1997    Family History  Problem Relation Age of Onset  . Heart disease Father        died complications GB surgery perf and sepsis  . Hypertension Father   . Alcohol abuse Father   . Hyperlipidemia Father   . Dupuytren's contracture Father   . Diabetes Maternal Grandmother   .  Hyperlipidemia Mother   . Dementia Mother        Peabody dementia and Parkinson's  . Alcohol abuse Brother   . Hyperlipidemia Brother   . Hypertension Brother   . Diabetes Brother   . Dupuytren's contracture Brother   . Alcohol abuse Brother   . Hyperlipidemia Brother   . Heart disease Paternal Grandfather   . Cervical cancer Paternal Aunt        Cervical Cancer    Social History   Social History  . Marital status: Married    Spouse name: N/A  . Number of children: N/A  . Years of education: N/A   Occupational History  . Retired    Social History Main Topics  . Smoking status: Former Smoker    Packs/day: 0.50    Types: Cigarettes    Quit date: 12/27/2007  . Smokeless tobacco: Never Used  . Alcohol use 1.2 - 1.8 oz/week    2 - 3 Glasses of wine per week  . Drug use:  No  . Sexual activity: Yes    Partners: Male    Birth control/ protection: Surgical     Comment: Hysterectomy   Other Topics Concern  . None   Social History Narrative   6-9 hours of sleep per night   Lives with her husband  hh of 2    Has 1 dog and a few fish   Retired on 06/09/13   [place] [facility] [place]    \husband worked for [employer]   She worked ciommunications   G5 G2   [place] no tob ogg for6 years onset age 89 years    Neg ets FA pos safely zx 3           Outpatient Medications Prior to Visit  Medication Sig Dispense Refill  . acetaminophen (TYLENOL) 500 MG tablet Take 500 mg by mouth every 6 (six) hours as needed.      . ALPRAZolam (XANAX) 1 MG tablet as needed.    Marland Kitchen aspirin 81 MG tablet Take 81 mg by mouth daily.    Marland Kitchen atorvastatin (LIPITOR) 20 MG tablet TAKE 1 TABLET (20 MG TOTAL) BY MOUTH DAILY. 90 tablet 2  . Calcium Carbonate (CALCIUM 600 PO) Take 1 tablet by mouth daily.    . Cholecalciferol (VITAMIN D3) 2000 UNITS TABS Once daily     . famotidine (PEPCID) 10 MG tablet Take 10 mg by mouth 2 (two) times daily.    . fexofenadine (ALLEGRA) 180 MG tablet Take 180 mg by mouth daily.    . fluticasone (FLONASE) 50 MCG/ACT nasal spray Place 1 spray into both nostrils daily.    Marland Kitchen losartan (COZAAR) 50 MG tablet TAKE 1 TABLET (50 MG TOTAL) BY MOUTH DAILY. 90 tablet 1  . Multiple Vitamins-Minerals (CENTRUM SILVER PO) Once daily     . Nutritional Supplements (ESTROVEN NIGHTTIME PO) Take by mouth.    Marland Kitchen PRISTIQ 50 MG 24 hr tablet     . PROLIA 60 MG/ML SOLN injection Inject 60 mg into the skin every 6 (six) months.    . TRAZODONE HCL PO Take 50 mg by mouth as needed.      No facility-administered medications prior to visit.      EXAM:  BP 122/90 (BP Location: Right Arm, Patient Position: Sitting, Cuff Size: Normal)   Pulse 73   Temp 98.4 F (36.9 C) (Oral)   Ht '5\' 5"'  (1.651 m)   Wt 160 lb 8 oz (72.8 kg)  LMP 01/27/1995 (Approximate)   BMI 26.71 kg/m   Body mass index  is 26.71 kg/m. Wt Readings from Last 3 Encounters:  08/24/16 160 lb 8 oz (72.8 kg)  07/31/16 162 lb (73.5 kg)  03/10/16 166 lb (75.3 kg)    Physical Exam: Vital signs reviewed PPI:RJJO is a well-developed well-nourished alert cooperative    who appearsr stated age in no acute distress.  HEENT: normocephalic atraumatic , Eyes: PERRL EOM's full, conjunctiva clear, Nares: paten,t no deformity discharge or tenderness., Ears: no deformity EAC's clear TMs with normal landmarks. Mouth: clear OP, no lesions, edema.  Moist mucous membranes. Dentition in adequate repair. NECK: supple without masses, thyromegaly or bruits. CHEST/PULM:  Clear to auscultation and percussion breath sounds equal no wheeze , rales or rhonchi. No chest wall deformities or tenderness. Breast: normal by inspection . No dimpling, discharge, masses, tenderness or discharge . CV: PMI is nondisplaced, S1 S2 no gallops, murmurs, rubs. Peripheral pulses are full without delay.No JVD .  ABDOMEN: Bowel sounds normal nontender  No guard or rebound, no hepato splenomegal no CVA tenderness.  No hernia. Extremtities:  No clubbing cyanosis or edema, no acute joint swelling or redness no focal atrophy NEURO:  Oriented x3, cranial nerves 3-12 appear to be intact, no obvious focal weakness,gait within normal limits no abnormal reflexes or asymmetrical SKIN: No acute rashes normal turgor, color, no bruising or petechiae. PSYCH: Oriented, good eye contact, no obvious depression anxiety, cognition and judgment appear normal. LN: no cervical axillary inguinal adenopathy  Lab Results  Component Value Date   WBC 6.4 07/31/2016   HGB 13.6 07/31/2016   HCT 40.0 07/31/2016   PLT 303.0 07/31/2016   GLUCOSE 93 07/31/2016   CHOL 186 08/24/2016   TRIG 80.0 08/24/2016   HDL 64.30 08/24/2016   LDLCALC 106 (H) 08/24/2016   ALT 20 07/31/2016   AST 19 07/31/2016   NA 141 07/31/2016   K 4.3 07/31/2016   CL 105 07/31/2016   CREATININE 0.78  07/31/2016   BUN 15 07/31/2016   CO2 30 07/31/2016   TSH 2.01 08/23/2015   HGBA1C 5.5 08/23/2015    BP Readings from Last 3 Encounters:  08/24/16 122/90  07/31/16 (!) 150/80  03/10/16 (!) 170/90     Lab results reviewed with patient   ASSESSMENT AND PLAN:  Discussed the following assessment and plan:  Visit for preventive health examination - Plan: Lipid panel, VITAMIN D 25 Hydroxy (Vit-D Deficiency, Fractures)  Hyperlipidemia, unspecified hyperlipidemia type - Plan: Lipid panel, VITAMIN D 25 Hydroxy (Vit-D Deficiency, Fractures)  Medication management - Plan: Lipid panel, VITAMIN D 25 Hydroxy (Vit-D Deficiency, Fractures)  Essential hypertension - check readings 3-5 days send in and if  not at goal consider intesification of  rx - Plan: Lipid panel, VITAMIN D 25 Hydroxy (Vit-D Deficiency, Fractures)  Osteoporosis without current pathological fracture, unspecified osteoporosis type - Plan: VITAMIN D 25 Hydroxy (Vit-D Deficiency, Fractures), DG Bone Density  Patient Care Team: Burnis Medin, MD as PCP - General (Internal Medicine) Belva Chimes, MD as Referring Physician (Orthopedic Surgery) Massie Bougie, MD as Referring Physician Chucky May, MD as Consulting Physician (Psychiatry) Kem Boroughs, FNP as Nurse Practitioner (Gynecology) Patient Instructions  Continue lifestyle intervention healthy eating and exercise . Glad you are doing better .   Check into shingles vaccine. Shingrix  Can receive either in the office or pharmacy depending on insurance rules.  checking vit d and lipid panel today   Sent in order to get  DEXA scan at breast center   You may have to call them if not contacted about appt.  If all ok then yearly check up.     Preventive Care 40-64 Years, Female Preventive care refers to lifestyle choices and visits with your health care provider that can promote health and wellness. What does preventive care include?  A yearly physical exam. This  is also called an annual well check.  Dental exams once or twice a year.  Routine eye exams. Ask your health care provider how often you should have your eyes checked.  Personal lifestyle choices, including: ? Daily care of your teeth and gums. ? Regular physical activity. ? Eating a healthy diet. ? Avoiding tobacco and drug use. ? Limiting alcohol use. ? Practicing safe sex. ? Taking low-dose aspirin daily starting at age 24. ? Taking vitamin and mineral supplements as recommended by your health care provider. What happens during an annual well check? The services and screenings done by your health care provider during your annual well check will depend on your age, overall health, lifestyle risk factors, and family history of disease. Counseling Your health care provider may ask you questions about your:  Alcohol use.  Tobacco use.  Drug use.  Emotional well-being.  Home and relationship well-being.  Sexual activity.  Eating habits.  Work and work Statistician.  Method of birth control.  Menstrual cycle.  Pregnancy history.  Screening You may have the following tests or measurements:  Height, weight, and BMI.  Blood pressure.  Lipid and cholesterol levels. These may be checked every 5 years, or more frequently if you are over 85 years old.  Skin check.  Lung cancer screening. You may have this screening every year starting at age 58 if you have a 30-pack-year history of smoking and currently smoke or have quit within the past 15 years.  Fecal occult blood test (FOBT) of the stool. You may have this test every year starting at age 25.  Flexible sigmoidoscopy or colonoscopy. You may have a sigmoidoscopy every 5 years or a colonoscopy every 10 years starting at age 35.  Hepatitis C blood test.  Hepatitis B blood test.  Sexually transmitted disease (STD) testing.  Diabetes screening. This is done by checking your blood sugar (glucose) after you have not  eaten for a while (fasting). You may have this done every 1-3 years.  Mammogram. This may be done every 1-2 years. Talk to your health care provider about when you should start having regular mammograms. This may depend on whether you have a family history of breast cancer.  BRCA-related cancer screening. This may be done if you have a family history of breast, ovarian, tubal, or peritoneal cancers.  Pelvic exam and Pap test. This may be done every 3 years starting at age 14. Starting at age 6, this may be done every 5 years if you have a Pap test in combination with an HPV test.  Bone density scan. This is done to screen for osteoporosis. You may have this scan if you are at high risk for osteoporosis.  Discuss your test results, treatment options, and if necessary, the need for more tests with your health care provider. Vaccines Your health care provider may recommend certain vaccines, such as:  Influenza vaccine. This is recommended every year.  Tetanus, diphtheria, and acellular pertussis (Tdap, Td) vaccine. You may need a Td booster every 10 years.  Varicella vaccine. You may need this if you have not been vaccinated.  Zoster vaccine. You may need this after age 67.  Measles, mumps, and rubella (MMR) vaccine. You may need at least one dose of MMR if you were born in 1957 or later. You may also need a second dose.  Pneumococcal 13-valent conjugate (PCV13) vaccine. You may need this if you have certain conditions and were not previously vaccinated.  Pneumococcal polysaccharide (PPSV23) vaccine. You may need one or two doses if you smoke cigarettes or if you have certain conditions.  Meningococcal vaccine. You may need this if you have certain conditions.  Hepatitis A vaccine. You may need this if you have certain conditions or if you travel or work in places where you may be exposed to hepatitis A.  Hepatitis B vaccine. You may need this if you have certain conditions or if you  travel or work in places where you may be exposed to hepatitis B.  Haemophilus influenzae type b (Hib) vaccine. You may need this if you have certain conditions.  Talk to your health care provider about which screenings and vaccines you need and how often you need them. This information is not intended to replace advice given to you by your health care provider. Make sure you discuss any questions you have with your health care provider. Document Released: 02/08/2015 Document Revised: 10/02/2015 Document Reviewed: 11/13/2014 Elsevier Interactive Patient Education  2017 [place]. Lorita Forinash M.D.

## 2016-08-24 ENCOUNTER — Ambulatory Visit (INDEPENDENT_AMBULATORY_CARE_PROVIDER_SITE_OTHER): Payer: Federal, State, Local not specified - PPO | Admitting: Internal Medicine

## 2016-08-24 ENCOUNTER — Encounter: Payer: Self-pay | Admitting: Internal Medicine

## 2016-08-24 VITALS — BP 122/90 | HR 73 | Temp 98.4°F | Ht 65.0 in | Wt 160.5 lb

## 2016-08-24 DIAGNOSIS — I10 Essential (primary) hypertension: Secondary | ICD-10-CM | POA: Diagnosis not present

## 2016-08-24 DIAGNOSIS — Z79899 Other long term (current) drug therapy: Secondary | ICD-10-CM | POA: Diagnosis not present

## 2016-08-24 DIAGNOSIS — Z0000 Encounter for general adult medical examination without abnormal findings: Secondary | ICD-10-CM | POA: Diagnosis not present

## 2016-08-24 DIAGNOSIS — E785 Hyperlipidemia, unspecified: Secondary | ICD-10-CM | POA: Diagnosis not present

## 2016-08-24 DIAGNOSIS — M810 Age-related osteoporosis without current pathological fracture: Secondary | ICD-10-CM

## 2016-08-24 LAB — LIPID PANEL
CHOL/HDL RATIO: 3
Cholesterol: 186 mg/dL (ref 0–200)
HDL: 64.3 mg/dL (ref >39.00)
LDL Cholesterol: 106 mg/dL — ABNORMAL HIGH (ref 0–99)
NONHDL: 122.14
Triglycerides: 80 mg/dL (ref 0.0–149.0)
VLDL: 16 mg/dL (ref 0.0–40.0)

## 2016-08-24 LAB — VITAMIN D 25 HYDROXY (VIT D DEFICIENCY, FRACTURES): VITD: 35 ng/mL (ref 30.00–100.00)

## 2016-08-24 NOTE — Patient Instructions (Addendum)
Continue lifestyle intervention healthy eating and exercise . Glad you are doing better .   Check into shingles vaccine. Shingrix  Can receive either in the office or pharmacy depending on insurance rules.  checking vit d and lipid panel today   Sent in order to get [facility]   You may have to call them if not contacted about appt.  If all ok then yearly check up.     Preventive Care 40-64 Years, Female Preventive care refers to lifestyle choices and visits with your health care provider that can promote health and wellness. What does preventive care include?  A yearly physical exam. This is also called an annual well check.  Dental exams once or twice a year.  Routine eye exams. Ask your health care provider how often you should have your eyes checked.  Personal lifestyle choices, including: ? Daily care of your teeth and gums. ? Regular physical activity. ? Eating a healthy diet. ? Avoiding tobacco and drug use. ? Limiting alcohol use. ? Practicing safe sex. ? Taking low-dose aspirin daily starting at age 67. ? Taking vitamin and mineral supplements as recommended by your health care provider. What happens during an annual well check? The services and screenings done by your health care provider during your annual well check will depend on your age, overall health, lifestyle risk factors, and family history of disease. Counseling Your health care provider may ask you questions about your:  Alcohol use.  Tobacco use.  Drug use.  Emotional well-being.  Home and relationship well-being.  Sexual activity.  Eating habits.  Work and work Statistician.  Method of birth control.  Menstrual cycle.  Pregnancy history.  Screening You may have the following tests or measurements:  Height, weight, and BMI.  Blood pressure.  Lipid and cholesterol levels. These may be checked every 5 years, or more frequently if you are over 60 years old.  Skin  check.  Lung cancer screening. You may have this screening every year starting at age 81 if you have a 30-pack-year history of smoking and currently smoke or have quit within the past 15 years.  Fecal occult blood test (FOBT) of the stool. You may have this test every year starting at age 40.  Flexible sigmoidoscopy or colonoscopy. You may have a sigmoidoscopy every 5 years or a colonoscopy every 10 years starting at age 47.  Hepatitis C blood test.  Hepatitis B blood test.  Sexually transmitted disease (STD) testing.  Diabetes screening. This is done by checking your blood sugar (glucose) after you have not eaten for a while (fasting). You may have this done every 1-3 years.  Mammogram. This may be done every 1-2 years. Talk to your health care provider about when you should start having regular mammograms. This may depend on whether you have a family history of breast cancer.  BRCA-related cancer screening. This may be done if you have a family history of breast, ovarian, tubal, or peritoneal cancers.  Pelvic exam and Pap test. This may be done every 3 years starting at age 64. Starting at age 23, this may be done every 5 years if you have a Pap test in combination with an HPV test.  Bone density scan. This is done to screen for osteoporosis. You may have this scan if you are at high risk for osteoporosis.  Discuss your test results, treatment options, and if necessary, the need for more tests with your health care provider. Vaccines Your  health care provider may recommend certain vaccines, such as:  Influenza vaccine. This is recommended every year.  Tetanus, diphtheria, and acellular pertussis (Tdap, Td) vaccine. You may need a Td booster every 10 years.  Varicella vaccine. You may need this if you have not been vaccinated.  Zoster vaccine. You may need this after age 76.  Measles, mumps, and rubella (MMR) vaccine. You may need at least one dose of MMR if you were born in 1957  or later. You may also need a second dose.  Pneumococcal 13-valent conjugate (PCV13) vaccine. You may need this if you have certain conditions and were not previously vaccinated.  Pneumococcal polysaccharide (PPSV23) vaccine. You may need one or two doses if you smoke cigarettes or if you have certain conditions.  Meningococcal vaccine. You may need this if you have certain conditions.  Hepatitis A vaccine. You may need this if you have certain conditions or if you travel or work in places where you may be exposed to hepatitis A.  Hepatitis B vaccine. You may need this if you have certain conditions or if you travel or work in places where you may be exposed to hepatitis B.  Haemophilus influenzae type b (Hib) vaccine. You may need this if you have certain conditions.  Talk to your health care provider about which screenings and vaccines you need and how often you need them. This information is not intended to replace advice given to you by your health care provider. Make sure you discuss any questions you have with your health care provider. Document Released: 02/08/2015 Document Revised: 10/02/2015 Document Reviewed: 11/13/2014 Elsevier Interactive Patient Education  2017 [employer].

## 2016-08-31 ENCOUNTER — Ambulatory Visit
Admission: RE | Admit: 2016-08-31 | Discharge: 2016-08-31 | Disposition: A | Discharge status: Home / Self Care | Payer: Federal, State, Local not specified - PPO | Source: Ambulatory Visit | Attending: Internal Medicine | Admitting: Internal Medicine

## 2016-08-31 DIAGNOSIS — M810 Age-related osteoporosis without current pathological fracture: Secondary | ICD-10-CM

## 2016-10-01 ENCOUNTER — Encounter: Payer: Self-pay | Admitting: Internal Medicine

## 2016-10-16 ENCOUNTER — Encounter: Payer: Self-pay | Admitting: Internal Medicine

## 2016-10-31 ENCOUNTER — Encounter: Payer: Self-pay | Admitting: Internal Medicine

## 2016-11-20 ENCOUNTER — Telehealth: Payer: Self-pay | Admitting: Internal Medicine

## 2016-11-20 NOTE — Telephone Encounter (Signed)
Pt called back returning your call °

## 2016-11-20 NOTE — Telephone Encounter (Signed)
Pt states she received letter for the dr to order her prolia. Pt sent mychart message 2 separate times and has not heard anything back Please advise!! thanks

## 2016-11-20 NOTE — Telephone Encounter (Signed)
Called pt and left a detailed to call office back.

## 2016-11-23 NOTE — Telephone Encounter (Signed)
Please  call pt at [phone]

## 2016-11-23 NOTE — Telephone Encounter (Signed)
Spoke with pt, I will have to call the number pt provided in her email to inform the insurance that pt needs prolia. Will call pt back later today.

## 2016-11-25 MED ORDER — PROLIA 60 MG/ML ~~LOC~~ SOLN: 60.0000 mg | SUBCUTANEOUS | 1 refills | Status: DC

## 2016-11-25 NOTE — Telephone Encounter (Signed)
[facility] needed to validate Proila for pt, I faxed over a new Rx to [phone]  We are currently awaiting for pharmacy to send medication to office. Pt was called and made aware.

## 2016-11-29 ENCOUNTER — Other Ambulatory Visit: Payer: Self-pay | Admitting: Internal Medicine

## 2016-12-09 ENCOUNTER — Ambulatory Visit (INDEPENDENT_AMBULATORY_CARE_PROVIDER_SITE_OTHER): Payer: Federal, State, Local not specified - PPO

## 2016-12-09 DIAGNOSIS — M810 Age-related osteoporosis without current pathological fracture: Secondary | ICD-10-CM

## 2016-12-09 MED ORDER — DENOSUMAB 60 MG/ML ~~LOC~~ SOLN
60.0000 mg | Freq: Once | SUBCUTANEOUS | Status: AC
  Administered 2016-12-09: 60 mg via SUBCUTANEOUS

## 2017-01-12 ENCOUNTER — Ambulatory Visit: Payer: Federal, State, Local not specified - PPO | Admitting: Family Medicine

## 2017-02-11 ENCOUNTER — Other Ambulatory Visit: Payer: Self-pay | Admitting: Internal Medicine

## 2017-02-13 ENCOUNTER — Other Ambulatory Visit: Payer: Self-pay | Admitting: Internal Medicine

## 2017-03-23 ENCOUNTER — Ambulatory Visit: Payer: Federal, State, Local not specified - PPO | Admitting: Internal Medicine

## 2017-03-23 ENCOUNTER — Encounter: Payer: Self-pay | Admitting: Internal Medicine

## 2017-03-23 VITALS — BP 158/92 | HR 102 | Temp 98.2°F | Wt 167.4 lb

## 2017-03-23 DIAGNOSIS — J069 Acute upper respiratory infection, unspecified: Secondary | ICD-10-CM | POA: Diagnosis not present

## 2017-03-23 DIAGNOSIS — J019 Acute sinusitis, unspecified: Secondary | ICD-10-CM

## 2017-03-23 DIAGNOSIS — I10 Essential (primary) hypertension: Secondary | ICD-10-CM

## 2017-03-23 MED ORDER — AMOXICILLIN-POT CLAVULANATE 875-125 MG PO TABS: 1.0000 | ORAL_TABLET | Freq: Two times a day (BID) | ORAL | 0 refills | Status: DC

## 2017-03-23 NOTE — Patient Instructions (Signed)
  Treating for sinusitis     Med for 5 -7days  Saline nose spray   Hot tea and honey     No pneumonia     Sounds.     Expect   Cough to last another week but getting better .   Try afrin at night for 3 nights if needed.

## 2017-03-23 NOTE — Progress Notes (Signed)
Chief Complaint  Patient presents with  . Cough    cold symptoms X2 weeks, yellow/green mucous, taking mucinx D and Zicam, chest and head congestion, runny nose, no chest pain, no fever, hot flashes, no chills    HPI: Yolanda Copeland 67 y.o.  sda    Onset  Initially uncertain  If relapsing  And now  Increasing   Again  pnd    thjick nasal drainage   No fever no pain  But coughing gis tireing  Taking  Above      bp had been good    Before the illness   125 range  No asthma    ROS: See pertinent positives and negatives per HPI.  Past Medical History:  Diagnosis Date  . Abnormal CXR   . Allergy   . Broken ankle 12/10   atraumatic fracture   . Cervicalgia   . Chicken pox   . Colon polyps    on 5 year recalldr Gamin   . COPD (chronic obstructive pulmonary disease) ([place])   . Depression   . Encounter for long-term (current) use of other medications   . GERD (gastroesophageal reflux disease)   . H/O amblyopia    hx patching left eye  . Hx of urinary stone    renal seen as incidental finding on x ray  . Hx of varicella   . Hyperlipemia   . Hypertension   . Migraine without aura   . Migraines   . Osteoporosis, unspecified   . Unspecified vitamin D deficiency     Family History  Problem Relation Age of Onset  . Heart disease Father        died complications GB surgery perf and sepsis  . Hypertension Father   . Alcohol abuse Father   . Hyperlipidemia Father   . Dupuytren's contracture Father   . Diabetes Maternal Grandmother   . Hyperlipidemia Mother   . Dementia Mother        Peabody dementia and Parkinson's  . Alcohol abuse Brother   . Hyperlipidemia Brother   . Hypertension Brother   . Diabetes Brother   . Dupuytren's contracture Brother   . Alcohol abuse Brother   . Hyperlipidemia Brother   . Heart disease Paternal Grandfather   . Cervical cancer Paternal Aunt        Cervical Cancer    Social History   Socioeconomic History  . Marital status: Married      Spouse name: None  . Number of children: None  . Years of education: None  . Highest education level: None  Social Needs  . Financial resource strain: None  . Food insecurity - worry: None  . Food insecurity - inability: None  . Transportation needs - medical: None  . Transportation needs - non-medical: None  Occupational History  . Occupation: Retired  Tobacco Use  . Smoking status: Former Smoker    Packs/day: 0.50    Types: Cigarettes    Last attempt to quit: 12/27/2007    Years since quitting: 9.2  . Smokeless tobacco: Never Used  Substance and Sexual Activity  . Alcohol use: Yes    Alcohol/week: 1.2 - 1.8 oz    Types: 2 - 3 Glasses of wine per week  . Drug use: No  . Sexual activity: Yes    Partners: Male    Birth control/protection: Surgical    Comment: Hysterectomy  Other Topics Concern  . None  Social History Narrative   6-9 hours  of sleep per night   Lives with her husband  hh of 2    Has 1 dog and a few fish   Retired on 06/09/13   [facility] [facility] [place]    \husband worked for [employer]   She worked ciommunications   G5 G2   [place] no tob ogg for6 years onset age 73 years    Neg ets FA pos safely zx 3        Outpatient Medications Prior to Visit  Medication Sig Dispense Refill  . acetaminophen (TYLENOL) 500 MG tablet Take 500 mg by mouth every 6 (six) hours as needed.      . ALPRAZolam (XANAX) 1 MG tablet as needed.    Marland Kitchen aspirin 81 MG tablet Take 81 mg by mouth daily.    Marland Kitchen atorvastatin (LIPITOR) 20 MG tablet TAKE 1 TABLET (20 MG TOTAL) BY MOUTH DAILY. 90 tablet 0  . Calcium Carbonate (CALCIUM 600 PO) Take 1 tablet by mouth daily.    . Cholecalciferol (VITAMIN D3) 2000 UNITS TABS Once daily     . famotidine (PEPCID) 10 MG tablet Take 10 mg by mouth 2 (two) times daily.    . fexofenadine (ALLEGRA) 180 MG tablet Take 180 mg by mouth daily.    . fluticasone (FLONASE) 50 MCG/ACT nasal spray Place 1 spray into both nostrils daily.    Marland Kitchen losartan (COZAAR) 50 MG  tablet TAKE 1 TABLET (50 MG TOTAL) BY MOUTH DAILY. 90 tablet 1  . Multiple Vitamins-Minerals (CENTRUM SILVER PO) Once daily     . Nutritional Supplements (ESTROVEN NIGHTTIME PO) Take by mouth.    Marland Kitchen PRISTIQ 50 MG 24 hr tablet     . PROLIA 60 MG/ML SOLN injection Inject 60 mg into the skin every 6 (six) months. 1 Syringe 1  . TRAZODONE HCL PO Take 50 mg by mouth as needed.      No facility-administered medications prior to visit.      EXAM:  BP (!) 158/92 (BP Location: Right Arm, Patient Position: Sitting, Cuff Size: Normal)   Pulse (!) 102   Temp 98.2 F (36.8 C) (Oral)   Wt 167 lb 6.4 oz (75.9 kg)   LMP 01/27/1995 (Approximate)   BMI 27.86 kg/m   Body mass index is 27.86 kg/m. WDWN in NAD  quiet respirations;verycongested  somewhat hoarse. Non toxic . HEENT: Normocephalic ;atraumatic , Eyes;  PERRL, EOMs  Full, lids and conjunctiva clear,,Ears: no deformities, canals nl, TM landmarks normal, Nose: no deformity or discharge but congested;face minimally tender Mouth : OP clear without lesion or edema . Neck: Supple without adenopathy or masses or bruits Chest:  Clear to A without wheezes rales or rhonchi wheeze  Clear after cough  CV:  S1-S2 no gallops or murmurs peripheral perfusion is normal Skin :nl perfusion and no acute rashes   ASSESSMENT AND PLAN:  Discussed the following assessment and plan:  Upper respiratory infection with cough and congestion  Acute non-recurrent sinusitis, unspecified location  Essential hypertension Prolonged 2 weeks resp infection  Relapsing sx  Poss sinusitis    Empiric rx reasonable  Benefit more than risk   Watch bp  ssinus hygiene  -Patient advised to return or notify health care team  if symptoms worsen ,persist or new concerns arise.  Patient Instructions   Treating for sinusitis     Med for 5 -7days  Saline nose spray   Hot tea and honey     No pneumonia     Sounds.  Expect   Cough to last another week but getting better .    Try afrin at night for 3 nights if needed.     Standley Brooking. Panosh M.D.

## 2017-05-07 ENCOUNTER — Ambulatory Visit: Payer: Federal, State, Local not specified - PPO | Admitting: Internal Medicine

## 2017-05-07 ENCOUNTER — Encounter: Payer: Self-pay | Admitting: Internal Medicine

## 2017-05-07 VITALS — BP 104/68 | HR 76 | Temp 97.9°F | Wt 171.1 lb

## 2017-05-07 DIAGNOSIS — J989 Respiratory disorder, unspecified: Secondary | ICD-10-CM

## 2017-05-07 DIAGNOSIS — R6889 Other general symptoms and signs: Secondary | ICD-10-CM | POA: Diagnosis not present

## 2017-05-07 DIAGNOSIS — R509 Fever, unspecified: Secondary | ICD-10-CM | POA: Diagnosis not present

## 2017-05-07 LAB — POC INFLUENZA A&B (BINAX/QUICKVUE)
Influenza A, POC: NEGATIVE
Influenza B, POC: NEGATIVE

## 2017-05-07 NOTE — Progress Notes (Signed)
Chief Complaint  Patient presents with  . Fever    Fever x 3 days of 101-102. Temp normal today. Pt having severe coughing. Waking with heavy sweats. Pt states that she started coughing up green/yellow mucous x 1 day. Decreased energy. Advil/ASA for fever.  Pt had an episode of vomiting on the way to her appt this morning.     HPI: Yolanda Copeland 67 y.o.  SDA  Onset  tues   Cough and fever   Since evening  April 9   .101.6 last night.  advil aleve.    No body aches  X  Fever .   Tired.   Nausea taking fluids  Not a lot of ur congestion Last illness  End of feb better with antibiotic  And was all better before this onset.  Here with spouse today   Minimal productive  Some green?   ROS: See pertinent positives and negatives per HPI. No rash  No new  Exposures   Husband had adeno with gi sx  A few weeks ago  Past Medical History:  Diagnosis Date  . Abnormal CXR   . Allergy   . Broken ankle 12/10   atraumatic fracture   . Cervicalgia   . Chicken pox   . Colon polyps    on 5 year recalldr Gamin   . COPD (chronic obstructive pulmonary disease) ([place])   . Depression   . Encounter for long-term (current) use of other medications   . GERD (gastroesophageal reflux disease)   . H/O amblyopia    hx patching left eye  . Hx of urinary stone    renal seen as incidental finding on x ray  . Hx of varicella   . Hyperlipemia   . Hypertension   . Migraine without aura   . Migraines   . Osteoporosis, unspecified   . Unspecified vitamin D deficiency     Family History  Problem Relation Age of Onset  . Heart disease Father        died complications GB surgery perf and sepsis  . Hypertension Father   . Alcohol abuse Father   . Hyperlipidemia Father   . Dupuytren's contracture Father   . Diabetes Maternal Grandmother   . Hyperlipidemia Mother   . Dementia Mother        Peabody dementia and Parkinson's  . Alcohol abuse Brother   . Hyperlipidemia Brother   . Hypertension Brother    . Diabetes Brother   . Dupuytren's contracture Brother   . Alcohol abuse Brother   . Hyperlipidemia Brother   . Heart disease Paternal Grandfather   . Cervical cancer Paternal Aunt        Cervical Cancer    Social History   Socioeconomic History  . Marital status: Married    Spouse name: Not on file  . Number of children: Not on file  . Years of education: Not on file  . Highest education level: Not on file  Occupational History  . Occupation: Retired  Scientific laboratory technician  . Financial resource strain: Not on file  . Food insecurity:    Worry: Not on file    Inability: Not on file  . Transportation needs:    Medical: Not on file    Non-medical: Not on file  Tobacco Use  . Smoking status: Former Smoker    Packs/day: 0.50    Types: Cigarettes    Last attempt to quit: 12/27/2007    Years since quitting: 9.3  .  Smokeless tobacco: Never Used  Substance and Sexual Activity  . Alcohol use: Yes    Alcohol/week: 1.2 - 1.8 oz    Types: 2 - 3 Glasses of wine per week  . Drug use: No  . Sexual activity: Yes    Partners: Male    Birth control/protection: Surgical    Comment: Hysterectomy  Lifestyle  . Physical activity:    Days per week: Not on file    Minutes per session: Not on file  . Stress: Not on file  Relationships  . Social connections:    Talks on phone: Not on file    Gets together: Not on file    Attends religious service: Not on file    Active member of club or organization: Not on file    Attends meetings of clubs or organizations: Not on file    Relationship status: Not on file  Other Topics Concern  . Not on file  Social History Narrative   6-9 hours of sleep per night   Lives with her husband  hh of 2    Has 1 dog and a few fish   Retired on 06/09/13   [employer] [place]    \husband worked for [employer]   She worked ciommunications   G5 G2   [place] no tob ogg for6 years onset age 25 years    Neg ets FA pos safely zx 3        Outpatient Medications  Prior to Visit  Medication Sig Dispense Refill  . acetaminophen (TYLENOL) 500 MG tablet Take 500 mg by mouth every 6 (six) hours as needed.      . ALPRAZolam (XANAX) 1 MG tablet as needed.    Marland Kitchen aspirin 81 MG tablet Take 81 mg by mouth daily.    Marland Kitchen atorvastatin (LIPITOR) 20 MG tablet TAKE 1 TABLET (20 MG TOTAL) BY MOUTH DAILY. 90 tablet 0  . Calcium Carbonate (CALCIUM 600 PO) Take 1 tablet by mouth daily.    . Cholecalciferol (VITAMIN D3) 2000 UNITS TABS Once daily     . famotidine (PEPCID) 10 MG tablet Take 10 mg by mouth 2 (two) times daily.    . fexofenadine (ALLEGRA) 180 MG tablet Take 180 mg by mouth daily.    . fluticasone (FLONASE) 50 MCG/ACT nasal spray Place 1 spray into both nostrils daily.    Marland Kitchen losartan (COZAAR) 50 MG tablet TAKE 1 TABLET (50 MG TOTAL) BY MOUTH DAILY. 90 tablet 1  . Multiple Vitamins-Minerals (CENTRUM SILVER PO) Once daily     . Nutritional Supplements (ESTROVEN NIGHTTIME PO) Take by mouth.    Marland Kitchen PRISTIQ 50 MG 24 hr tablet     . PROLIA 60 MG/ML SOLN injection Inject 60 mg into the skin every 6 (six) months. 1 Syringe 1  . TRAZODONE HCL PO Take 50 mg by mouth as needed.     Marland Kitchen amoxicillin-clavulanate (AUGMENTIN) 875-125 MG tablet Take 1 tablet by mouth every 12 (twelve) hours. (Patient not taking: Reported on 05/07/2017) 14 tablet 0   No facility-administered medications prior to visit.      EXAM:  BP 104/68 (BP Location: Left Arm, Patient Position: Sitting, Cuff Size: Normal)   Pulse 76   Temp 97.9 F (36.6 C) (Oral)   Wt 171 lb 1.6 oz (77.6 kg)   LMP 01/27/1995 (Approximate)   SpO2 97%   BMI 28.47 kg/m   Body mass index is 28.47 kg/m.  GENERAL: vitals reviewed and listed above, alert, oriented,  appears well hydrated and in no acute distress no nontoxic but sick   HEENT: atraumatic, conjunctiva  clear, no obvious abnormalities on inspection of external nose and ears wax r eac tm gray  left tm nl  OP : no lesion edema or exudate  NECK: no obvious masses  on inspection palpation  LUNGS: clear to auscultation bilaterally, no wheezes, rales or rhonchi, good air movement CV: HRRR, no clubbing cyanosis or  peripheral edema nl cap refill  Abdomen:  Sof,t normal bowel sounds without hepatosplenomegaly, no guarding rebound or masses no CVA tenderness MS: moves all extremities without noticeable focal  Abnormality Skin nl  Cap refill  No acute rashes  PSYCH: pleasant and cooperative, no obvious depression or anxiety  ASSESSMENT AND PLAN:  Discussed the following assessment and plan:  Respiratory illness with fever - Plan: DG Chest 2 View  Flu-like symptoms - Plan: POC Influenza A&B(BINAX/QUICKVUE), DG Chest 2 View   Expectant management.   Fu alarm sx  And    Saturday clinic . Close observation advised  -Patient advised to return or notify health care team  if symptoms worsen ,persist or new concerns arise.  Patient Instructions  This acts like a   Viral infection  But if fever persists we should  get reevaluation   And  Chest xray .   Order placed .  Fever should be gone  by end of weekend and if not or worse plan reevaluation .   Fluids rest .    Standley Brooking. Anona Giovannini M.D.

## 2017-05-07 NOTE — Patient Instructions (Addendum)
This acts like a   Viral infection  But if fever persists we should  get reevaluation   And  Chest xray .   Order placed .  Fever should be gone  by end of weekend and if not or worse plan reevaluation .   Fluids rest .

## 2017-05-11 DIAGNOSIS — N819 Female genital prolapse, unspecified: Secondary | ICD-10-CM | POA: Insufficient documentation

## 2017-05-11 DIAGNOSIS — N3946 Mixed incontinence: Secondary | ICD-10-CM | POA: Insufficient documentation

## 2017-05-13 ENCOUNTER — Other Ambulatory Visit: Payer: Self-pay | Admitting: Internal Medicine

## 2017-06-04 ENCOUNTER — Other Ambulatory Visit: Payer: Self-pay | Admitting: Internal Medicine

## 2017-06-04 DIAGNOSIS — Z1231 Encounter for screening mammogram for malignant neoplasm of breast: Secondary | ICD-10-CM

## 2017-06-23 ENCOUNTER — Telehealth: Payer: Self-pay | Admitting: *Deleted

## 2017-06-23 MED ORDER — DENOSUMAB 60 MG/ML ~~LOC~~ SOSY: 60.0000 mg | PREFILLED_SYRINGE | Freq: Once | SUBCUTANEOUS | 0 refills | Status: AC

## 2017-06-23 NOTE — Telephone Encounter (Signed)
Prolia insurance verified. Rx sent to [facility]. Waiting to see if a prior - auth is needed.

## 2017-06-28 ENCOUNTER — Ambulatory Visit
Admission: RE | Admit: 2017-06-28 | Discharge: 2017-06-28 | Disposition: A | Discharge status: Home / Self Care | Payer: Federal, State, Local not specified - PPO | Source: Ambulatory Visit | Attending: Internal Medicine | Admitting: Internal Medicine

## 2017-06-28 DIAGNOSIS — Z1231 Encounter for screening mammogram for malignant neoplasm of breast: Secondary | ICD-10-CM

## 2017-06-29 ENCOUNTER — Telehealth: Payer: Self-pay | Admitting: Internal Medicine

## 2017-06-29 NOTE — Telephone Encounter (Signed)
Copied from [place]. Topic: Quick Communication - See Telephone Encounter >> Jun 29, 2017  1:54 PM Hewitt Shorts wrote: Pt is calling to state that she has prior approval for prolia the office needs to call [facility] to giive them the ok to ship the prolia to the office   Phone [facility] [phone]

## 2017-06-29 NOTE — Telephone Encounter (Signed)
Pt calling to see if there prolia has arrived at the office yet. Pt states she spoke with [facility] about 2 1/2 weeks ago and they advised pt they were sending this out to her dr. Abbott Pao calling to schedule injection if this med has arrived

## 2017-06-29 NOTE — Telephone Encounter (Signed)
Left message on machine for patient that a prior auth has been started for Prolia.  We will call the patient back to schedule an appointment as soon as we get the approval for her Prior Auth.

## 2017-06-29 NOTE — Telephone Encounter (Signed)
Duplicate phone note. Will close this note.

## 2017-06-29 NOTE — Telephone Encounter (Signed)
Copied from [place]. Topic: Quick Communication - See Telephone Encounter >> Jun 29, 2017  1:54 PM Hewitt Shorts wrote: Pt is calling to state that she has prior approval for prolia the [facility] needs to [facility] to giive them the ok to ship the prolia to the office   Phone [facility] [phone]

## 2017-07-02 NOTE — Telephone Encounter (Signed)
Left message on machine for patient to return our call about Prolia.  Patient will need to go through [facility]  Patient can also register for the savings card

## 2017-07-05 NOTE — Telephone Encounter (Signed)
Spoke with patient and she states that she has already paid her $25 for her prolia injection and [facility] just needs a verbal prescription order.  [facility] and send a mychart message back.

## 2017-07-07 NOTE — Telephone Encounter (Signed)
Pharmacy called to get the verbal ok to send in the prolia; contact number below to advise  Phone: [phone]

## 2017-07-16 NOTE — Telephone Encounter (Signed)
Pt called back in she said that she spoke with pharmacy and they provided a phone # for the office to call so that they can okay them to ship prolia to office.    Phone [phone] OPT # 1

## 2017-07-16 NOTE — Telephone Encounter (Signed)
Left message on machine for patient to see if it is now okay to call [facility] mailorder for prolia  CRM created

## 2017-07-16 NOTE — Telephone Encounter (Signed)
Verbal order given.  Pharmacy will contact patient and Prolia will be mailed to the office.

## 2017-07-28 NOTE — Telephone Encounter (Signed)
Spoke with Quillian Quince at [facility] will be shipped to the office 08/03/17.

## 2017-08-06 ENCOUNTER — Ambulatory Visit: Payer: Self-pay

## 2017-08-10 ENCOUNTER — Other Ambulatory Visit: Payer: Self-pay | Admitting: Internal Medicine

## 2017-08-11 ENCOUNTER — Ambulatory Visit: Payer: Self-pay

## 2017-08-12 ENCOUNTER — Ambulatory Visit (INDEPENDENT_AMBULATORY_CARE_PROVIDER_SITE_OTHER): Payer: Federal, State, Local not specified - PPO

## 2017-08-12 DIAGNOSIS — M810 Age-related osteoporosis without current pathological fracture: Secondary | ICD-10-CM | POA: Diagnosis not present

## 2017-08-12 MED ORDER — DENOSUMAB 60 MG/ML ~~LOC~~ SOSY
60.0000 mg | PREFILLED_SYRINGE | Freq: Once | SUBCUTANEOUS | Status: AC
  Administered 2017-08-12: 60 mg via SUBCUTANEOUS

## 2017-08-12 NOTE — Progress Notes (Addendum)
Per orders of Dr. Regis Bill, injection of Prolia 60mg  given by Rebecca Eaton. Patient tolerated injection well.

## 2017-08-13 ENCOUNTER — Ambulatory Visit: Payer: Self-pay

## 2017-08-31 DIAGNOSIS — E7800 Pure hypercholesterolemia, unspecified: Secondary | ICD-10-CM | POA: Diagnosis not present

## 2017-08-31 DIAGNOSIS — M810 Age-related osteoporosis without current pathological fracture: Secondary | ICD-10-CM | POA: Diagnosis not present

## 2017-08-31 DIAGNOSIS — F419 Anxiety disorder, unspecified: Secondary | ICD-10-CM | POA: Diagnosis not present

## 2017-08-31 DIAGNOSIS — Z0000 Encounter for general adult medical examination without abnormal findings: Secondary | ICD-10-CM | POA: Diagnosis not present

## 2017-08-31 DIAGNOSIS — J4520 Mild intermittent asthma, uncomplicated: Secondary | ICD-10-CM | POA: Diagnosis not present

## 2017-08-31 DIAGNOSIS — Z9109 Other allergy status, other than to drugs and biological substances: Secondary | ICD-10-CM | POA: Diagnosis not present

## 2017-08-31 DIAGNOSIS — R7309 Other abnormal glucose: Secondary | ICD-10-CM | POA: Diagnosis not present

## 2017-08-31 DIAGNOSIS — R946 Abnormal results of thyroid function studies: Secondary | ICD-10-CM | POA: Diagnosis not present

## 2017-08-31 DIAGNOSIS — F325 Major depressive disorder, single episode, in full remission: Secondary | ICD-10-CM | POA: Diagnosis not present

## 2017-08-31 DIAGNOSIS — E559 Vitamin D deficiency, unspecified: Secondary | ICD-10-CM | POA: Diagnosis not present

## 2017-08-31 DIAGNOSIS — I10 Essential (primary) hypertension: Secondary | ICD-10-CM | POA: Diagnosis not present

## 2017-09-08 DIAGNOSIS — F419 Anxiety disorder, unspecified: Secondary | ICD-10-CM | POA: Diagnosis not present

## 2017-09-08 DIAGNOSIS — E7800 Pure hypercholesterolemia, unspecified: Secondary | ICD-10-CM | POA: Diagnosis not present

## 2017-09-08 DIAGNOSIS — F325 Major depressive disorder, single episode, in full remission: Secondary | ICD-10-CM | POA: Diagnosis not present

## 2017-09-08 DIAGNOSIS — J4520 Mild intermittent asthma, uncomplicated: Secondary | ICD-10-CM | POA: Diagnosis not present

## 2017-09-08 DIAGNOSIS — I10 Essential (primary) hypertension: Secondary | ICD-10-CM | POA: Diagnosis not present

## 2017-09-08 DIAGNOSIS — M810 Age-related osteoporosis without current pathological fracture: Secondary | ICD-10-CM | POA: Diagnosis not present

## 2017-11-01 ENCOUNTER — Other Ambulatory Visit: Payer: Self-pay | Admitting: Internal Medicine

## 2017-11-10 DIAGNOSIS — H40053 Ocular hypertension, bilateral: Secondary | ICD-10-CM | POA: Diagnosis not present

## 2017-11-10 DIAGNOSIS — H2513 Age-related nuclear cataract, bilateral: Secondary | ICD-10-CM | POA: Diagnosis not present

## 2017-11-10 DIAGNOSIS — H53022 Refractive amblyopia, left eye: Secondary | ICD-10-CM | POA: Diagnosis not present

## 2017-11-10 DIAGNOSIS — H43813 Vitreous degeneration, bilateral: Secondary | ICD-10-CM | POA: Diagnosis not present

## 2017-12-01 DIAGNOSIS — E7800 Pure hypercholesterolemia, unspecified: Secondary | ICD-10-CM | POA: Diagnosis not present

## 2017-12-13 ENCOUNTER — Telehealth: Payer: Self-pay | Admitting: Acute Care

## 2017-12-13 DIAGNOSIS — Z87891 Personal history of nicotine dependence: Secondary | ICD-10-CM

## 2017-12-13 DIAGNOSIS — Z122 Encounter for screening for malignant neoplasm of respiratory organs: Secondary | ICD-10-CM

## 2017-12-14 NOTE — Telephone Encounter (Signed)
Attempted to call pt but unable to reach her. Left message for pt to return call. Routing message to Pearl for her to follow up on as pt was returning a call from her.

## 2017-12-14 NOTE — Telephone Encounter (Signed)
Patient asks to call her on cell phone [phone].  She is aware Langley Gauss will call her once she is back in the office.

## 2017-12-15 NOTE — Telephone Encounter (Signed)
Spoke with pt and scheduled SD[facility] 01-05-18 11:30 CT ordered Nothing further needed

## 2018-01-03 DIAGNOSIS — Z87891 Personal history of nicotine dependence: Secondary | ICD-10-CM | POA: Diagnosis not present

## 2018-01-03 DIAGNOSIS — J3489 Other specified disorders of nose and nasal sinuses: Secondary | ICD-10-CM | POA: Diagnosis not present

## 2018-01-03 DIAGNOSIS — K219 Gastro-esophageal reflux disease without esophagitis: Secondary | ICD-10-CM | POA: Diagnosis not present

## 2018-01-03 DIAGNOSIS — R05 Cough: Secondary | ICD-10-CM | POA: Diagnosis not present

## 2018-01-05 ENCOUNTER — Ambulatory Visit (INDEPENDENT_AMBULATORY_CARE_PROVIDER_SITE_OTHER): Payer: Medicare Other | Admitting: Acute Care

## 2018-01-05 ENCOUNTER — Ambulatory Visit (INDEPENDENT_AMBULATORY_CARE_PROVIDER_SITE_OTHER)
Admission: RE | Admit: 2018-01-05 | Discharge: 2018-01-05 | Disposition: A | Discharge status: Home / Self Care | Payer: Medicare Other | Source: Ambulatory Visit | Attending: Acute Care | Admitting: Acute Care

## 2018-01-05 ENCOUNTER — Encounter: Payer: Self-pay | Admitting: Acute Care

## 2018-01-05 VITALS — BP 122/76 | HR 74 | Ht 65.0 in | Wt 173.2 lb

## 2018-01-05 DIAGNOSIS — Z87891 Personal history of nicotine dependence: Secondary | ICD-10-CM

## 2018-01-05 DIAGNOSIS — Z122 Encounter for screening for malignant neoplasm of respiratory organs: Secondary | ICD-10-CM

## 2018-01-05 NOTE — Progress Notes (Signed)
Shared Decision Making Visit Lung Cancer Screening Program [phone])   Eligibility:  Age 67 y.o.  Pack Years Smoking History Calculation 39 pack year smoking history (# packs/per year x # years smoked)  Recent History of coughing up blood  no  Unexplained weight loss? no ( >Than 15 pounds within the last 6 months )  Prior History Lung / other cancer no (Diagnosis within the last 5 years already requiring surveillance chest CT Scans).  Smoking Status Former Smoker  Former Smokers: Years since quit: 10 years  Quit Date: 2009  Visit Components:  Discussion included one or more decision making aids. yes  Discussion included risk/benefits of screening. yes  Discussion included potential follow up diagnostic testing for abnormal scans. yes  Discussion included meaning and risk of over diagnosis. yes  Discussion included meaning and risk of False Positives. yes  Discussion included meaning of total radiation exposure. yes  Counseling Included:  Importance of adherence to annual lung cancer LDCT screening. yes  Impact of comorbidities on ability to participate in the program. yes  Ability and willingness to under diagnostic treatment. yes  Smoking Cessation Counseling:  Current Smokers:   Discussed importance of smoking cessation. No, former smoker  Information about tobacco cessation classes and interventions provided to patient. yes  Patient provided with "ticket" for LDCT Scan. yes  Symptomatic Patient. no  Counseling  Diagnosis Code: Tobacco Use Z72.0  Asymptomatic Patient yes  Counseling (Intermediate counseling: > three minutes counseling) U0454  Former Smokers:   Discussed the importance of maintaining cigarette abstinence. yes  Diagnosis Code: Personal History of Nicotine Dependence. U98.119  Information about tobacco cessation classes and interventions provided to patient. Yes  Patient provided with "ticket" for LDCT Scan. yes  Written Order  for Lung Cancer Screening with LDCT placed in Epic. Yes (CT Chest Lung Cancer Screening Low Dose W/O CM) [id number] Z12.2-Screening of respiratory organs Z87.891-Personal history of nicotine dependence   I spent 25 minutes of face to face time with Yolanda Copeland discussing the risks and benefits of lung cancer screening. We viewed a power point together that explained in detail the above noted topics. We took the time to pause the power point at intervals to allow for questions to be asked and answered to ensure understanding. We discussed that she had taken the single most powerful action possible to decrease her risk of developing lung cancer when she quit smoking. I counseled her to remain smoke free, and to contact me if she ever had the desire to smoke again so that I can provide resources and tools to help support the effort to remain smoke free. We discussed the time and location of the scan, and that either  Yolanda Glassman RN or I will call with the results within  24-48 hours of receiving them. She has my card and contact information in the event she needs to speak with me, in addition to a copy of the power point we reviewed as a resource. She verbalized understanding of all of the above and had no further questions upon leaving the office.     I explained to the patient that there has been a high incidence of coronary artery disease noted on these exams. I explained that this is a non-gated exam therefore degree or severity cannot be determined. This patient is currently on statin therapy. I have asked the patient to follow-up with their PCP regarding any incidental finding of coronary artery disease and management with diet or medication as they  feel is clinically indicated. The patient verbalized understanding of the above and had no further questions.     Yolanda Spatz, NP 01/05/2018 12:09 PM

## 2018-01-06 ENCOUNTER — Other Ambulatory Visit: Payer: Self-pay | Admitting: Acute Care

## 2018-01-06 DIAGNOSIS — Z87891 Personal history of nicotine dependence: Secondary | ICD-10-CM

## 2018-01-06 DIAGNOSIS — Z122 Encounter for screening for malignant neoplasm of respiratory organs: Secondary | ICD-10-CM

## 2018-01-31 ENCOUNTER — Other Ambulatory Visit: Payer: Self-pay | Admitting: Internal Medicine

## 2018-02-14 DIAGNOSIS — M810 Age-related osteoporosis without current pathological fracture: Secondary | ICD-10-CM | POA: Diagnosis not present

## 2018-02-18 ENCOUNTER — Encounter: Payer: Self-pay | Admitting: Pulmonary Disease

## 2018-02-18 ENCOUNTER — Ambulatory Visit (INDEPENDENT_AMBULATORY_CARE_PROVIDER_SITE_OTHER): Payer: Medicare Other | Admitting: Pulmonary Disease

## 2018-02-18 VITALS — BP 128/66 | HR 99 | Ht 65.0 in | Wt 172.0 lb

## 2018-02-18 DIAGNOSIS — R911 Solitary pulmonary nodule: Secondary | ICD-10-CM

## 2018-02-18 DIAGNOSIS — J479 Bronchiectasis, uncomplicated: Secondary | ICD-10-CM

## 2018-02-18 DIAGNOSIS — N390 Urinary tract infection, site not specified: Secondary | ICD-10-CM | POA: Diagnosis not present

## 2018-02-18 DIAGNOSIS — R05 Cough: Secondary | ICD-10-CM

## 2018-02-18 DIAGNOSIS — R059 Cough, unspecified: Secondary | ICD-10-CM

## 2018-02-18 MED ORDER — ALBUTEROL SULFATE (2.5 MG/3ML) 0.083% IN NEBU: 2.5000 mg | INHALATION_SOLUTION | Freq: Four times a day (QID) | RESPIRATORY_TRACT | 11 refills | Status: AC | PRN

## 2018-02-18 MED ORDER — SODIUM CHLORIDE 3 % IN NEBU: INHALATION_SOLUTION | Freq: Two times a day (BID) | RESPIRATORY_TRACT | 11 refills | Status: AC | PRN

## 2018-02-18 MED ORDER — FLUTTER DEVI: 0 refills | Status: AC

## 2018-02-18 NOTE — Progress Notes (Signed)
Synopsis: Referred in January 2020 for pulmonary nodule and emphysema, found to have right middle lobe and lingula bronchiectasis seen on a CT scan of her chest.  Had previously been evaluated at [facility] with a bronchoscopy..  Cigarette smoker for 30 years, 1.5 ppd, quit 2010.  Subjective:   PATIENT ID: Yolanda Copeland GENDER: female DOB: May 16, 1950, MRN: [id number]   HPI  Chief Complaint  Patient presents with  . Consult    self-referral for chronic cough Xseveral years.  has seen SG in [facility]      Cough: > has had this for several years > has been seen by ENT, treated for GERD with prilosec > has received nose spray which hasn't helped > started with bronchitis or pneumonia she thinks several years ago (about three years ago)  Saw Eston Esters in 2019 and was treated for asthmatic bronchitis.  In addition she tells me that in 2012 she had a bronchoscopy performed at [facility], special stains on transbronchial biopsies were negative for malignancy.  Also negative for organisms.  She will occasionally produce green to yellow mucus.  Her husband says that he is made efforts to minimize any sort of moisture mold in the house, they have no known mold in the home.  She has used it may used a humidifier from time to time.  She also has an air purifier which she has used over the years.  No cats or dogs in the house.   Used to smoke cigarettes, quit 10 years ago.  Smoked 30 years, 1.5 ppd.    Past Medical History:  Diagnosis Date  . Abnormal CXR   . Allergy   . Broken ankle 12/10   atraumatic fracture   . Cervicalgia   . Chicken pox   . Colon polyps    on 5 year recalldr Gamin   . COPD (chronic obstructive pulmonary disease) ([place])   . Depression   . Encounter for long-term (current) use of other medications   . GERD (gastroesophageal reflux disease)   . H/O amblyopia    hx patching left eye  . Hx of urinary stone    renal seen as incidental finding on x ray  . Hx of varicella   .  Hyperlipemia   . Hypertension   . Migraine without aura   . Migraines   . Osteoporosis, unspecified   . Unspecified vitamin D deficiency      Family History  Problem Relation Age of Onset  . Heart disease Father        died complications GB surgery perf and sepsis  . Hypertension Father   . Alcohol abuse Father   . Hyperlipidemia Father   . Dupuytren's contracture Father   . Diabetes Maternal Grandmother   . Hyperlipidemia Mother   . Dementia Mother        [place] dementia and Parkinson's  . Alcohol abuse Brother   . Hyperlipidemia Brother   . Hypertension Brother   . Diabetes Brother   . Dupuytren's contracture Brother   . Alcohol abuse Brother   . Hyperlipidemia Brother   . Heart disease Paternal Grandfather   . Cervical cancer Paternal Aunt        Cervical Cancer     Social History   Socioeconomic History  . Marital status: Married    Spouse name: Not on file  . Number of children: Not on file  . Years of education: Not on file  . Highest education level: Not on file  Occupational History  . Occupation: Retired  Scientific laboratory technician  . Financial resource strain: Not on file  . Food insecurity:    Worry: Not on file    Inability: Not on file  . Transportation needs:    Medical: Not on file    Non-medical: Not on file  Tobacco Use  . Smoking status: Former Smoker    Packs/day: 1.00    Years: 39.00    Pack years: 39.00    Types: Cigarettes    Last attempt to quit: 12/27/2007    Years since quitting: 10.1  . Smokeless tobacco: Never Used  Substance and Sexual Activity  . Alcohol use: Yes    Alcohol/week: 2.0 - 3.0 standard drinks    Types: 2 - 3 Glasses of wine per week  . Drug use: No  . Sexual activity: Yes    Partners: Male    Birth control/protection: Surgical    Comment: Hysterectomy  Lifestyle  . Physical activity:    Days per week: Not on file    Minutes per session: Not on file  . Stress: Not on file  Relationships  . Social connections:     Talks on phone: Not on file    Gets together: Not on file    Attends religious service: Not on file    Active member of club or organization: Not on file    Attends meetings of clubs or organizations: Not on file    Relationship status: Not on file  . Intimate partner violence:    Fear of current or ex partner: Not on file    Emotionally abused: Not on file    Physically abused: Not on file    Forced sexual activity: Not on file  Other Topics Concern  . 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 Erythromycin Nausea Only and Nausea And Vomiting     Outpatient Medications Prior to Visit  Medication Sig Dispense Refill  . acetaminophen (TYLENOL) 500 MG tablet Take 500 mg by mouth every 6 (six) hours as needed.      . ALPRAZolam (XANAX) 1 MG tablet as needed.    Marland Kitchen aspirin 81 MG tablet Take 81 mg by mouth daily.    Marland Kitchen atorvastatin (LIPITOR) 20 MG tablet TAKE 1 TABLET (20 MG TOTAL) BY MOUTH DAILY. 90 tablet 0  . Calcium Carbonate (CALCIUM 600 PO) Take 1 tablet by mouth daily.    . Cholecalciferol (VITAMIN D3) 2000 UNITS TABS Once daily     . famotidine (PEPCID) 10 MG tablet Take 10 mg by mouth 2 (two) times daily.    . fexofenadine (ALLEGRA) 180 MG tablet Take 180 mg by mouth daily.    . fluticasone (FLONASE) 50 MCG/ACT nasal spray Place 1 spray into both nostrils daily.    Marland Kitchen ipratropium (ATROVENT) 0.06 % nasal spray Place 2 sprays into both nostrils 2 (two) times daily.    Marland Kitchen losartan (COZAAR) 100 MG tablet Take 0.5 tablets (50 mg  total) by mouth daily. Needs physical 15 tablet 0  . Multiple Vitamins-Minerals (CENTRUM SILVER PO) Once daily     . Nutritional  Supplements (ESTROVEN NIGHTTIME PO) Take by mouth.    Marland Kitchen PRISTIQ 50 MG 24 hr tablet     . TRAZODONE HCL PO Take 50 mg by mouth as needed.      No facility-administered medications prior to visit.     Review of Systems  Constitutional: Negative for fever and weight loss.  HENT: Positive for congestion. Negative for ear pain, nosebleeds and sore throat.   Eyes: Negative for redness.  Respiratory: Positive for cough, sputum production and shortness of breath. Negative for wheezing.   Cardiovascular: Negative for palpitations, leg swelling and PND.  Gastrointestinal: Negative for nausea and vomiting.  Genitourinary: Negative for dysuria.  Skin: Negative for rash.  Neurological: Negative for headaches.  Endo/Heme/Allergies: Does not bruise/bleed easily.  Psychiatric/Behavioral: Negative for depression. The patient is not nervous/anxious.       Objective:  Physical Exam   Vitals:   02/18/18 1429  BP: 128/66  Pulse: 99  SpO2: 98%  Weight: 172 lb (78 kg)  Height: 5\' 5"  (1.651 m)    Gen: well appearing, no acute distress HENT: NCAT, OP clear, neck supple without masses Eyes: PERRL, EOMi Lymph: no cervical lymphadenopathy PULM: CTA B CV: RRR, no mgr, no JVD GI: BS+, soft, nontender, no hsm Derm: no rash or skin breakdown MSK: normal bulk and tone Neuro: A&Ox4, CN II-XII intact, strength 5/5 in all 4 extremities Psyche: normal mood and affect   CBC    Component Value Date/Time   WBC 6.4 07/31/2016 1344   RBC 4.49 07/31/2016 1344   HGB 13.6 07/31/2016 1344   HCT 40.0 07/31/2016 1344   PLT 303.0 07/31/2016 1344   MCV 89.0 07/31/2016 1344   MCHC 34.0 07/31/2016 1344   RDW 12.8 07/31/2016 1344   LYMPHSABS 2.3 07/31/2016 1344   MONOABS 0.7 07/31/2016 1344   EOSABS 0.0 07/31/2016 1344   BASOSABS 0.0 07/31/2016 1344     Chest imaging: December 2019 CT chest images independently reviewed showing mild centrilobular emphysema, some clustered nodules in the left upper  lobe, mucous plugging and bronchiectasis in the right middle lobe as well as the lingula  PFT:  Labs: 2012 BAL showed 4000 neutrophils, cytology negative, special stains negative  Path:  Echo:  Heart Catheterization:  Micro: 2012 BAL negative for AFB, negative fungus, negative bacteria    Assessment & Plan:   Cough - Plan: Ambulatory Referral for DME, Fungus Culture & Smear, Respiratory or Resp and Sputum Culture,  MYCOBACTERIA, CULTURE, WITH FLUOROCHROME SMEAR  Bronchiectasis without complication (HCC)  Pulmonary nodule  Discussion: This is a pleasant 68 year old female who comes to me today for evaluation of years of chronic cough.  She has bronchiectasis in the right middle lobe and lingula which is likely postinfectious based on her clinical history.  She is already had a good evaluation with a bronchoscopy in the past which showed no evidence of underlying AFB organisms.  That being said, the last bronchoscopy was performed 8 years ago so with ongoing bronchiectasis she is at risk for having developed a chronic infection like MAI since then.  I am hopeful however that if we can focus on mucociliary clearance that she will have an improvement in chest congestion and mucus production.  She may still have a lingering dry, hacking cough related to underlying gastroesophageal reflux disease or postnasal drip, however with treatment of  both these for the last month she is not seeing much improvement.  I told her today that she will cough every day for the rest of her life but my hope is that we will be able to help her manage it better.  Plan: Bronchiectasis: This is the medical term that means that your airways are bigger and more dilated than they should be, this makes you more susceptible to respiratory infection You need to make effort twice a day to help clear mucus out of your chest: -Take over-the-counter guaifenesin long-acting 1200 mg twice a day -Drink plenty water, at least  6 to 8 glasses of water a day -Use a flutter valve 10 breaths twice a day -Use hypertonic saline nebulized twice a day -Use albuterol as needed for chest tightness wheezing or shortness of breath Please provide [place] with a sample of your mucus so that we can tested for fungal, bacterial, AFB organisms  Cough: As stated today, this will continue.  I am hopeful that we can help you manage it better If you are still having trouble with cough on the next visit that we can consider cough suppressants if you are having a nagging, upper airway cough.  Gastroesophageal reflux disease: For now continue taking Prilosec  Allergic rhinitis: For now continue taking fluticasone as you are doing  We will see you back in 4 weeks or sooner if needed    Current Outpatient Medications:  .  acetaminophen (TYLENOL) 500 MG tablet, Take 500 mg by mouth every 6 (six) hours as needed.  , Disp: , Rfl:  .  ALPRAZolam (XANAX) 1 MG tablet, as needed., Disp: , Rfl:  .  aspirin 81 MG tablet, Take 81 mg by mouth daily., Disp: , Rfl:  .  atorvastatin (LIPITOR) 20 MG tablet, TAKE 1 TABLET (20 MG TOTAL) BY MOUTH DAILY., Disp: 90 tablet, Rfl: 0 .  Calcium Carbonate (CALCIUM 600 PO), Take 1 tablet by mouth daily., Disp: , Rfl:  .  Cholecalciferol (VITAMIN D3) 2000 UNITS TABS, Once daily , Disp: , Rfl:  .  famotidine (PEPCID) 10 MG tablet, Take 10 mg by mouth 2 (two) times daily., Disp: , Rfl:  .  fexofenadine (ALLEGRA) 180 MG tablet, Take 180 mg by mouth daily., Disp: , Rfl:  .  fluticasone (FLONASE) 50 MCG/ACT nasal spray, Place 1 spray into both nostrils daily., Disp: , Rfl:  .  ipratropium (ATROVENT) 0.06 % nasal spray, Place 2 sprays into both nostrils 2 (two) times daily., Disp: , Rfl:  .  losartan (COZAAR) 100 MG tablet, Take 0.5 tablets (50 mg total) by mouth daily. Needs physical, Disp: 15 tablet, Rfl: 0 .  Multiple Vitamins-Minerals (CENTRUM SILVER PO), Once daily , Disp: , Rfl:  .  Nutritional Supplements  (ESTROVEN NIGHTTIME PO), Take by mouth., Disp: , Rfl:  .  PRISTIQ 50 MG 24 hr tablet, , Disp: , Rfl:  .  TRAZODONE HCL PO, Take 50 mg by mouth as needed. , Disp: , Rfl:

## 2018-02-18 NOTE — Patient Instructions (Signed)
Bronchiectasis: This is the medical term that means that your airways are bigger and more dilated than they should be, this makes you more susceptible to respiratory infection You need to make effort twice a day to help clear mucus out of your chest: -Take over-the-counter guaifenesin long-acting 1200 mg twice a day -Drink plenty water, at least 6 to 8 glasses of water a day -Use a flutter valve 10 breaths twice a day -Use hypertonic saline nebulized twice a day -Use albuterol as needed for chest tightness wheezing or shortness of breath Please provide [place] with a sample of your mucus so that we can tested for fungal, bacterial, AFB organisms  Cough: As stated today, this will continue.  I am hopeful that we can help you manage it better If you are still having trouble with cough on the next visit that we can consider cough suppressants if you are having a nagging, upper airway cough.  Gastroesophageal reflux disease: For now continue taking Prilosec  Allergic rhinitis: For now continue taking fluticasone as you are doing  We will see you back in 4 weeks or sooner if needed

## 2018-02-24 ENCOUNTER — Other Ambulatory Visit: Payer: Self-pay | Admitting: Internal Medicine

## 2018-03-26 ENCOUNTER — Other Ambulatory Visit: Payer: Self-pay | Admitting: Internal Medicine

## 2018-03-31 ENCOUNTER — Ambulatory Visit: Payer: Medicare Other | Admitting: Pulmonary Disease

## 2018-04-06 ENCOUNTER — Other Ambulatory Visit: Payer: Self-pay | Admitting: Internal Medicine

## 2018-04-06 NOTE — Telephone Encounter (Signed)
Pharmacy would like to change to lisinopril since all arb's are mostly on back order please advise

## 2018-04-18 NOTE — Telephone Encounter (Signed)
Request to change to lisinopril is denied  Prefer to not change to medication more likely to cause a cough . (Lisinopril is a different medication class) Patient has  bronchiectesis and pulmonary condition.  So send in amlodipine  5 mg 1 po qd disp 90 . For hypertension And D/c losartan  Reason   Unavailable.   If  bp becomes   To low ( 100 and below) then  We can adjust  Dose To 2.5 mg per day . Please communicate this to patient.  Thanks

## 2018-05-10 NOTE — Telephone Encounter (Addendum)
contact patient about  Plan  Below    And send in nmnew med is she agrees   I cant tell from this message if done

## 2018-05-17 ENCOUNTER — Telehealth: Payer: Self-pay | Admitting: *Deleted

## 2018-05-17 DIAGNOSIS — J449 Chronic obstructive pulmonary disease, unspecified: Secondary | ICD-10-CM

## 2018-05-17 MED ORDER — AMBULATORY NON FORMULARY MEDICATION: 0 refills | Status: AC

## 2018-05-17 NOTE — Telephone Encounter (Signed)
Rx printed and will be given to Eric Form to sign. Once signed, will fax to number provided. Nothing further needed.

## 2018-05-17 NOTE — Telephone Encounter (Signed)
Called patient re: [employer]. She would like an order faxed for a nebulizer. She has been renting and would like to buy one straight out. She will call back to provide name of company and fax #.

## 2018-05-17 NOTE — Telephone Encounter (Signed)
Pt called back in to let us know that the company is [employer]: [phone]  Order # [id number]

## 2018-05-18 ENCOUNTER — Ambulatory Visit: Payer: Medicare Other | Admitting: Pulmonary Disease

## 2018-07-25 ENCOUNTER — Inpatient Hospital Stay: Admission: RE | Admit: 2018-07-25 | Payer: Medicare Other | Source: Ambulatory Visit

## 2018-08-17 DIAGNOSIS — M810 Age-related osteoporosis without current pathological fracture: Secondary | ICD-10-CM | POA: Diagnosis not present

## 2018-08-23 ENCOUNTER — Ambulatory Visit (INDEPENDENT_AMBULATORY_CARE_PROVIDER_SITE_OTHER)
Admission: RE | Admit: 2018-08-23 | Discharge: 2018-08-23 | Disposition: A | Discharge status: Home / Self Care | Payer: Medicare Other | Source: Ambulatory Visit | Attending: Acute Care | Admitting: Acute Care

## 2018-08-23 ENCOUNTER — Other Ambulatory Visit: Payer: Self-pay

## 2018-08-23 DIAGNOSIS — Z87891 Personal history of nicotine dependence: Secondary | ICD-10-CM

## 2018-08-23 DIAGNOSIS — Z122 Encounter for screening for malignant neoplasm of respiratory organs: Secondary | ICD-10-CM

## 2018-08-23 DIAGNOSIS — R918 Other nonspecific abnormal finding of lung field: Secondary | ICD-10-CM | POA: Diagnosis not present

## 2018-08-23 DIAGNOSIS — J432 Centrilobular emphysema: Secondary | ICD-10-CM | POA: Diagnosis not present

## 2018-08-23 DIAGNOSIS — J9809 Other diseases of bronchus, not elsewhere classified: Secondary | ICD-10-CM | POA: Diagnosis not present

## 2018-08-31 ENCOUNTER — Other Ambulatory Visit: Payer: Self-pay | Admitting: Family Medicine

## 2018-08-31 ENCOUNTER — Telehealth: Payer: Self-pay

## 2018-08-31 DIAGNOSIS — Z1231 Encounter for screening mammogram for malignant neoplasm of breast: Secondary | ICD-10-CM

## 2018-08-31 NOTE — Telephone Encounter (Signed)
Called [facility] cause it looks to be pt is under there care now and notified them of this and told them to contact pulmonary for further questions

## 2018-08-31 NOTE — Telephone Encounter (Signed)
Tried also calling patient got no answer and unable to leave voicemail. Called [facility] listed to inform them

## 2018-08-31 NOTE — Telephone Encounter (Signed)
Copied from [place] [id number]. Topic: General - Other >> Aug 31, 2018 12:25 PM Valla Leaver wrote: Reason for CRM: Dr. Eric Form at [facility] calling to notify Dr. Regis Bill of abnormal findings on chest CT. Says she is sending Panosh a message in EPI but it's important she review findings and follow up with the patient.

## 2018-09-02 ENCOUNTER — Other Ambulatory Visit: Payer: Self-pay | Admitting: Family Medicine

## 2018-09-02 ENCOUNTER — Other Ambulatory Visit: Payer: Self-pay | Admitting: *Deleted

## 2018-09-02 DIAGNOSIS — Z87891 Personal history of nicotine dependence: Secondary | ICD-10-CM

## 2018-09-02 DIAGNOSIS — Z122 Encounter for screening for malignant neoplasm of respiratory organs: Secondary | ICD-10-CM

## 2018-09-02 DIAGNOSIS — R599 Enlarged lymph nodes, unspecified: Secondary | ICD-10-CM

## 2018-09-05 DIAGNOSIS — E7800 Pure hypercholesterolemia, unspecified: Secondary | ICD-10-CM | POA: Diagnosis not present

## 2018-09-05 DIAGNOSIS — R7309 Other abnormal glucose: Secondary | ICD-10-CM | POA: Diagnosis not present

## 2018-09-05 DIAGNOSIS — I10 Essential (primary) hypertension: Secondary | ICD-10-CM | POA: Diagnosis not present

## 2018-09-12 DIAGNOSIS — Z0000 Encounter for general adult medical examination without abnormal findings: Secondary | ICD-10-CM | POA: Diagnosis not present

## 2018-09-12 DIAGNOSIS — M810 Age-related osteoporosis without current pathological fracture: Secondary | ICD-10-CM | POA: Diagnosis not present

## 2018-09-12 DIAGNOSIS — E7800 Pure hypercholesterolemia, unspecified: Secondary | ICD-10-CM | POA: Diagnosis not present

## 2018-09-12 DIAGNOSIS — R9389 Abnormal findings on diagnostic imaging of other specified body structures: Secondary | ICD-10-CM | POA: Diagnosis not present

## 2018-09-12 DIAGNOSIS — I10 Essential (primary) hypertension: Secondary | ICD-10-CM | POA: Diagnosis not present

## 2018-09-12 DIAGNOSIS — R7989 Other specified abnormal findings of blood chemistry: Secondary | ICD-10-CM | POA: Diagnosis not present

## 2018-09-12 DIAGNOSIS — Z23 Encounter for immunization: Secondary | ICD-10-CM | POA: Diagnosis not present

## 2018-09-12 DIAGNOSIS — R7309 Other abnormal glucose: Secondary | ICD-10-CM | POA: Diagnosis not present

## 2018-09-12 DIAGNOSIS — K219 Gastro-esophageal reflux disease without esophagitis: Secondary | ICD-10-CM | POA: Diagnosis not present

## 2018-09-12 DIAGNOSIS — J449 Chronic obstructive pulmonary disease, unspecified: Secondary | ICD-10-CM | POA: Diagnosis not present

## 2018-09-13 ENCOUNTER — Ambulatory Visit
Admission: RE | Admit: 2018-09-13 | Discharge: 2018-09-13 | Disposition: A | Discharge status: Home / Self Care | Payer: Medicare Other | Source: Ambulatory Visit | Attending: Family Medicine | Admitting: Family Medicine

## 2018-09-13 ENCOUNTER — Other Ambulatory Visit: Payer: Self-pay

## 2018-09-13 DIAGNOSIS — K8020 Calculus of gallbladder without cholecystitis without obstruction: Secondary | ICD-10-CM | POA: Diagnosis not present

## 2018-09-13 DIAGNOSIS — R599 Enlarged lymph nodes, unspecified: Secondary | ICD-10-CM

## 2018-09-13 DIAGNOSIS — N200 Calculus of kidney: Secondary | ICD-10-CM | POA: Diagnosis not present

## 2018-09-13 MED ORDER — IOPAMIDOL (ISOVUE-300) INJECTION 61%
100.0000 mL | Freq: Once | INTRAVENOUS | Status: AC | PRN
  Administered 2018-09-13: 100 mL via INTRAVENOUS

## 2018-09-14 ENCOUNTER — Other Ambulatory Visit (HOSPITAL_COMMUNITY): Payer: Self-pay | Admitting: Family Medicine

## 2018-09-14 DIAGNOSIS — R599 Enlarged lymph nodes, unspecified: Secondary | ICD-10-CM

## 2018-09-14 DIAGNOSIS — R591 Generalized enlarged lymph nodes: Secondary | ICD-10-CM

## 2018-09-16 ENCOUNTER — Other Ambulatory Visit: Payer: Self-pay | Admitting: Family Medicine

## 2018-09-16 ENCOUNTER — Other Ambulatory Visit (HOSPITAL_COMMUNITY): Payer: Self-pay | Admitting: Family Medicine

## 2018-09-16 DIAGNOSIS — R591 Generalized enlarged lymph nodes: Secondary | ICD-10-CM

## 2018-09-16 DIAGNOSIS — R599 Enlarged lymph nodes, unspecified: Secondary | ICD-10-CM

## 2018-09-23 ENCOUNTER — Other Ambulatory Visit: Payer: Self-pay

## 2018-09-23 ENCOUNTER — Ambulatory Visit (HOSPITAL_COMMUNITY)
Admission: RE | Admit: 2018-09-23 | Discharge: 2018-09-23 | Disposition: A | Discharge status: Home / Self Care | Payer: Medicare Other | Source: Ambulatory Visit | Attending: Family Medicine | Admitting: Family Medicine

## 2018-09-23 DIAGNOSIS — R591 Generalized enlarged lymph nodes: Secondary | ICD-10-CM | POA: Diagnosis not present

## 2018-09-23 DIAGNOSIS — R918 Other nonspecific abnormal finding of lung field: Secondary | ICD-10-CM | POA: Diagnosis not present

## 2018-09-23 DIAGNOSIS — R599 Enlarged lymph nodes, unspecified: Secondary | ICD-10-CM

## 2018-09-23 LAB — GLUCOSE, CAPILLARY: Glucose-Capillary: 112 mg/dL — ABNORMAL HIGH (ref 70–99)

## 2018-09-23 MED ORDER — FLUDEOXYGLUCOSE F - 18 (FDG) INJECTION
8.6100 | Freq: Once | INTRAVENOUS | Status: AC | PRN
  Administered 2018-09-23: 8.61 via INTRAVENOUS

## 2018-09-27 ENCOUNTER — Other Ambulatory Visit (HOSPITAL_COMMUNITY): Payer: Self-pay | Admitting: Family Medicine

## 2018-09-27 DIAGNOSIS — R591 Generalized enlarged lymph nodes: Secondary | ICD-10-CM

## 2018-09-27 DIAGNOSIS — R599 Enlarged lymph nodes, unspecified: Secondary | ICD-10-CM

## 2018-09-29 ENCOUNTER — Other Ambulatory Visit: Payer: Self-pay | Admitting: Physician Assistant

## 2018-09-30 ENCOUNTER — Ambulatory Visit (HOSPITAL_COMMUNITY)
Admission: RE | Admit: 2018-09-30 | Discharge: 2018-09-30 | Disposition: A | Discharge status: Home / Self Care | Payer: Medicare Other | Source: Ambulatory Visit | Attending: Family Medicine | Admitting: Family Medicine

## 2018-09-30 ENCOUNTER — Other Ambulatory Visit: Payer: Self-pay

## 2018-09-30 ENCOUNTER — Encounter (HOSPITAL_COMMUNITY): Payer: Self-pay

## 2018-09-30 DIAGNOSIS — R591 Generalized enlarged lymph nodes: Secondary | ICD-10-CM | POA: Diagnosis not present

## 2018-09-30 DIAGNOSIS — Z8541 Personal history of malignant neoplasm of cervix uteri: Secondary | ICD-10-CM | POA: Diagnosis not present

## 2018-09-30 DIAGNOSIS — I10 Essential (primary) hypertension: Secondary | ICD-10-CM | POA: Insufficient documentation

## 2018-09-30 DIAGNOSIS — Z8349 Family history of other endocrine, nutritional and metabolic diseases: Secondary | ICD-10-CM | POA: Insufficient documentation

## 2018-09-30 DIAGNOSIS — J449 Chronic obstructive pulmonary disease, unspecified: Secondary | ICD-10-CM | POA: Insufficient documentation

## 2018-09-30 DIAGNOSIS — Z79899 Other long term (current) drug therapy: Secondary | ICD-10-CM | POA: Diagnosis not present

## 2018-09-30 DIAGNOSIS — Z8249 Family history of ischemic heart disease and other diseases of the circulatory system: Secondary | ICD-10-CM | POA: Insufficient documentation

## 2018-09-30 DIAGNOSIS — E559 Vitamin D deficiency, unspecified: Secondary | ICD-10-CM | POA: Diagnosis not present

## 2018-09-30 DIAGNOSIS — R599 Enlarged lymph nodes, unspecified: Secondary | ICD-10-CM

## 2018-09-30 DIAGNOSIS — K219 Gastro-esophageal reflux disease without esophagitis: Secondary | ICD-10-CM | POA: Diagnosis not present

## 2018-09-30 DIAGNOSIS — Z87891 Personal history of nicotine dependence: Secondary | ICD-10-CM | POA: Diagnosis not present

## 2018-09-30 DIAGNOSIS — F329 Major depressive disorder, single episode, unspecified: Secondary | ICD-10-CM | POA: Diagnosis not present

## 2018-09-30 DIAGNOSIS — Z833 Family history of diabetes mellitus: Secondary | ICD-10-CM | POA: Diagnosis not present

## 2018-09-30 DIAGNOSIS — M810 Age-related osteoporosis without current pathological fracture: Secondary | ICD-10-CM | POA: Diagnosis not present

## 2018-09-30 DIAGNOSIS — E785 Hyperlipidemia, unspecified: Secondary | ICD-10-CM | POA: Insufficient documentation

## 2018-09-30 DIAGNOSIS — C8291 Follicular lymphoma, unspecified, lymph nodes of head, face, and neck: Secondary | ICD-10-CM | POA: Diagnosis not present

## 2018-09-30 DIAGNOSIS — R590 Localized enlarged lymph nodes: Secondary | ICD-10-CM | POA: Diagnosis not present

## 2018-09-30 LAB — PROTIME-INR
INR: 0.9 (ref 0.8–1.2)
Prothrombin Time: 12 seconds (ref 11.4–15.2)

## 2018-09-30 LAB — CBC
HCT: 40.7 % (ref 36.0–46.0)
Hemoglobin: 13.1 g/dL (ref 12.0–15.0)
MCH: 29.4 pg (ref 26.0–34.0)
MCHC: 32.2 g/dL (ref 30.0–36.0)
MCV: 91.3 fL (ref 80.0–100.0)
Platelets: 328 10*3/uL (ref 150–400)
RBC: 4.46 MIL/uL (ref 3.87–5.11)
RDW: 12.6 % (ref 11.5–15.5)
WBC: 6 10*3/uL (ref 4.0–10.5)
nRBC: 0 % (ref 0.0–0.2)

## 2018-09-30 MED ORDER — SODIUM CHLORIDE 0.9 % IV SOLN: INTRAVENOUS | Status: DC

## 2018-09-30 MED ORDER — FENTANYL CITRATE (PF) 100 MCG/2ML IJ SOLN
INTRAMUSCULAR | Status: AC | PRN
  Administered 2018-09-30: 50 ug via INTRAVENOUS
  Administered 2018-09-30: 25 ug via INTRAVENOUS

## 2018-09-30 MED ORDER — LIDOCAINE HCL (PF) 1 % IJ SOLN
INTRAMUSCULAR | Status: AC
  Filled 2018-09-30: qty 30

## 2018-09-30 MED ORDER — MIDAZOLAM HCL 2 MG/2ML IJ SOLN
INTRAMUSCULAR | Status: AC
  Filled 2018-09-30: qty 2

## 2018-09-30 MED ORDER — FENTANYL CITRATE (PF) 100 MCG/2ML IJ SOLN
INTRAMUSCULAR | Status: AC
  Filled 2018-09-30: qty 2

## 2018-09-30 MED ORDER — MIDAZOLAM HCL 2 MG/2ML IJ SOLN
INTRAMUSCULAR | Status: AC | PRN
  Administered 2018-09-30 (×2): 1 mg via INTRAVENOUS

## 2018-09-30 NOTE — Discharge Instructions (Signed)
Needle Biopsy, Care After These instructions tell you how to care for yourself after your procedure. Your doctor may also give you more specific instructions. Call your doctor if you have any problems or questions. What can I expect after the procedure? After the procedure, it is common to have:  Soreness.  Bruising.  Mild pain. Follow these instructions at home:   Return to your normal activities as told by your doctor. Ask your doctor what activities are safe for you.  Take over-the-counter and prescription medicines only as told by your doctor.  Wash your hands with soap and water before you change your bandage (dressing). If you cannot use soap and water, use hand sanitizer.  Follow instructions from your doctor about: ? How to take care of your puncture site. ? When to remove your bandage. Remove bandaid in 24 hours. Then you may shower.  Check your puncture site every day for signs of infection. Watch for: ? Redness, swelling, or pain. ? Fluid or blood. ? Pus or a bad smell. ? Warmth.  Do not take baths, swim, or use a hot tub until your doctor approves. Ask your doctor if you may take showers.   Keep all follow-up visits as told by your doctor. This is important. Contact a doctor if you have:  A fever.  Redness, swelling, or pain at the puncture site, and it lasts longer than a few days.  Fluid, blood, or pus coming from the puncture site.  Warmth coming from the puncture site. Get help right away if:  You have a lot of bleeding from the puncture site. Summary  After the procedure, it is common to have soreness, bruising, or mild pain at the puncture site.  Check your puncture site every day for signs of infection, such as redness, swelling, or pain.  Get help right away if you have severe bleeding from your puncture site. This information is not intended to replace advice given to you by your health care provider. Make sure you discuss any questions you  have with your health care provider. Document Released: 12/26/2007 Document Revised: 01/25/2017 Document Reviewed: 01/25/2017 Elsevier Patient Education  2020 [place].  Moderate Conscious Sedation, Adult, Care After These instructions provide you with information about caring for yourself after your procedure. Your health care provider may also give you more specific instructions. Your treatment has been planned according to current medical practices, but problems sometimes occur. Call your health care provider if you have any problems or questions after your procedure. What can I expect after the procedure? After your procedure, it is common:  To feel sleepy for several hours.  To feel clumsy and have poor balance for several hours.  To have poor judgment for several hours.  To vomit if you eat too soon. Follow these instructions at home: For at least 24 hours after the procedure:   Do not: ? Participate in activities where you could fall or become injured. ? Drive. ? Use heavy machinery. ? Drink alcohol. ? Take sleeping pills or medicines that cause drowsiness. ? Make important decisions or sign legal documents. ? Take care of children on your own.  Rest. Eating and drinking  Follow the diet recommended by your health care provider.  If you vomit: ? Drink water, juice, or soup when you can drink without vomiting. ? Make sure you have little or no nausea before eating solid foods. General instructions  Have a responsible adult stay with you until you are awake and  alert.  Take over-the-counter and prescription medicines only as told by your health care provider.  If you smoke, do not smoke without supervision.  Keep all follow-up visits as told by your health care provider. This is important. Contact a health care provider if:  You keep feeling nauseous or you keep vomiting.  You feel light-headed.  You develop a rash.  You have a fever. Get help right  away if:  You have trouble breathing. This information is not intended to replace advice given to you by your health care provider. Make sure you discuss any questions you have with your health care provider. Document Released: 11/02/2012 Document Revised: 12/25/2016 Document Reviewed: 05/04/2015 Elsevier Patient Education  2020 [employer].

## 2018-09-30 NOTE — Procedures (Signed)
Interventional Radiology Procedure Note  Procedure: US guided lymph node biopsy  Complications: None  Estimated Blood Loss: < 10 mL  Findings: Left cervical LN core biopsy under US guidance. 18 G core biopsy x 4 submitted in saline.  Venetia Night. Kathlene Cote, M.D Pager:  [phone]

## 2018-09-30 NOTE — H&P (Signed)
Chief Complaint: Patient was seen in consultation today for LN biopsy at the request of [place]  Referring Physician(s): [place]  Supervising Physician: Aletta Edouard  Patient Status: [facility] - Out-pt  History of Present Illness: Yolanda Copeland is a 68 y.o. female being worked up for adenopathy. After review of all her imaging, she is referred for US guided biopsy of left neck LN. PMHx, meds, labs, imaging, allergies reviewed. Feels well, no recent fevers, chills, illness. Has been NPO today as directed.   Past Medical History:  Diagnosis Date   Abnormal CXR    Allergy    Broken ankle 12/10   atraumatic fracture    Cervicalgia    Chicken pox    Colon polyps    on 5 year recalldr Gamin    COPD (chronic obstructive pulmonary disease) ([place])    Depression    Encounter for long-term (current) use of other medications    GERD (gastroesophageal reflux disease)    H/O amblyopia    hx patching left eye   Hx of urinary stone    renal seen as incidental finding on x ray   Hx of varicella    Hyperlipemia    Hypertension    Migraine without aura    Migraines    Osteoporosis, unspecified    Unspecified vitamin D deficiency     Past Surgical History:  Procedure Laterality Date   ANKLE SURGERY  1997   plate fell    [place]  01/15/2014   BILATERAL SALPINGOOPHORECTOMY Bilateral spring 1999   3 days after [facility] pathology was suspicious and went back with [facility] and had Bright, 2003, 12/2013   x2; 2015 implants replaced   SHOULDER SURGERY Right 09/15/2013   TOTAL VAGINAL HYSTERECTOMY  spring 1999   menorrhagia; suspicious cells at cervix and fibroids    VESICOVAGINAL FISTULA CLOSURE W/ [facility]  1997    Allergies: Erythromycin  Medications: Prior to Admission medications   Medication Sig Start Date End Date Taking? Authorizing Provider  albuterol (PROVENTIL) (2.5  MG/3ML) 0.083% nebulizer solution Take 3 mLs (2.5 mg total) by nebulization every 6 (six) hours as needed for wheezing or shortness of breath. 02/18/18  Yes Juanito Doom, MD  ALPRAZolam Duanne Moron) 1 MG tablet Take 1 mg by mouth 2 (two) times daily as needed for anxiety.  12/31/12  Yes [provider]  atorvastatin (LIPITOR) 20 MG tablet TAKE 1 TABLET (20 MG TOTAL) BY MOUTH DAILY. Patient taking differently: Take 20 mg by mouth daily.  05/13/17  Yes Panosh, Standley Brooking, MD  Calcium Carbonate-Vitamin D3 (CALCIUM 600/VITAMIN D) 600-400 MG-UNIT TABS Take 1 tablet by mouth daily.   Yes [provider]  desvenlafaxine (PRISTIQ) 100 MG 24 hr tablet Take 100 mg by mouth at bedtime.  03/15/13  Yes [provider]  diphenhydrAMINE (BENADRYL) 25 mg capsule Take 25 mg by mouth at bedtime.   Yes [provider]  famotidine (PEPCID) 10 MG tablet Take 10 mg by mouth 2 (two) times daily.   Yes [provider]  fexofenadine (ALLEGRA) 180 MG tablet Take 180 mg by mouth daily as needed for allergies.    Yes [provider]  fluticasone (FLONASE) 50 MCG/ACT nasal spray Place 1 spray into both nostrils 2 (two) times daily.    Yes [provider]  Guaifenesin 1200 MG TB12 Take 1,200 mg by mouth daily.   Yes [provider]  losartan (  COZAAR) 100 MG tablet TAKE 0.5 TABLETS (50 MG TOTAL) BY MOUTH DAILY. NEEDS PHYSICAL Patient taking differently: Take 50 mg by mouth 2 (two) times daily. Needs physical 04/06/18  Yes Panosh, Standley Brooking, MD  Multiple Vitamins-Minerals (CENTRUM SILVER PO) Take 1 tablet by mouth daily. Once daily    Yes [provider]  sodium chloride HYPERTONIC 3 % nebulizer solution Take by nebulization 2 (two) times daily as needed for other. 02/18/18  Yes Juanito Doom, MD  AMBULATORY NON FORMULARY MEDICATION Medication Name: DME: [facility] DX: J44.9 Order # [id number] Please provide patient with nebulizer. 05/17/18   Magdalen Spatz, NP  Respiratory Therapy Supplies (FLUTTER) DEVI Use as directed 02/18/18   Juanito Doom, MD  traZODone (DESYREL) 50 MG tablet Take 50 mg by mouth at bedtime as needed for sleep.     [provider]     Family History  Problem Relation Age of Onset   Heart disease Father        died complications GB surgery perf and sepsis   Hypertension Father    Alcohol abuse Father    Hyperlipidemia Father    Dupuytren's contracture Father    Diabetes Maternal Grandmother    Hyperlipidemia Mother    Dementia Mother        [place] dementia and Parkinson's   Alcohol abuse Brother    Hyperlipidemia Brother    Hypertension Brother    Diabetes Brother    Dupuytren's contracture Brother    Alcohol abuse Brother    Hyperlipidemia Brother    Heart disease Paternal Grandfather    Cervical cancer Paternal Aunt        Cervical Cancer    Social History   Socioeconomic History   Marital status: Married    Spouse name: Not on file   Number of children: Not on file   Years of education: Not on file   Highest education level: Not on file  Occupational History   Occupation: Retired  Scientist, product/process development strain: Not on file   Food insecurity    Worry: Not on file    Inability: Not on Lexicographer needs    Medical: Not on file    Non-medical: Not on file  Tobacco Use   Smoking status: Former Smoker    Packs/day: 1.00    Years: 39.00    Pack years: 39.00    Types: Cigarettes    Quit date: 12/27/2007    Years since quitting: 10.7   Smokeless tobacco: Never Used  Substance and Sexual Activity   Alcohol use: Yes    Alcohol/week: 2.0 - 3.0 standard drinks    Types: 2 - 3 Glasses of wine per week   Drug use: No   Sexual activity: Yes    Partners: Male    Birth control/protection: Surgical    Comment: Hysterectomy  Lifestyle   Physical activity    Days per week: Not on file    Minutes per session: Not on file    Stress: Not on file  Relationships   Social connections    Talks on phone: Not on file    Gets together: Not on file    Attends religious service: Not on file    Active member of club or organization: Not on file    Attends meetings of clubs or organizations: Not on file    Relationship status: Not on file  Other Topics Concern   Not on  file  Social History Narrative   6-9 hours of sleep per night   Lives with her husband  hh of 2    Has 1 dog and a few fish   Retired on 06/09/13   [facility] [place]    \husband worked for [employer]   She worked [employer]   G5 G2   [place] no tob ogg for6 years onset age 68 years    Neg ets FA pos safely zx 3        Review of Systems: A 12 point ROS discussed and pertinent positives are indicated in the HPI above.  All other systems are negative.  Review of Systems  Vital Signs: BP 137/68    Pulse 88    Temp 97.6 F (36.4 C) (Oral)    Resp 16    Ht 5\' 5"  (1.651 m)    Wt 76.2 kg    LMP 01/27/1995 (Approximate)    SpO2 96%    BMI 27.96 kg/m   Physical Exam Constitutional:      Appearance: Normal appearance.  HENT:     Head: Normocephalic.     Mouth/Throat:     Mouth: Mucous membranes are moist.     Pharynx: Oropharynx is clear.  Neck:     Musculoskeletal: No neck rigidity or muscular tenderness.  Cardiovascular:     Rate and Rhythm: Normal rate and regular rhythm.     Heart sounds: Normal heart sounds.  Pulmonary:     Effort: Pulmonary effort is normal. No respiratory distress.     Breath sounds: Normal breath sounds.  Lymphadenopathy:     Cervical: No cervical adenopathy.  Skin:    General: Skin is warm and dry.  Neurological:     General: No focal deficit present.     Mental Status: She is alert and oriented to person, place, and time.  Psychiatric:        Mood and Affect: Mood normal.        Judgment: Judgment normal.       Imaging: Ct Abdomen Pelvis W Contrast  Result Date: 09/13/2018 CLINICAL DATA:   Adenopathy on lung cancer screening chest CT. EXAM: CT ABDOMEN AND PELVIS WITH CONTRAST TECHNIQUE: Multidetector CT imaging of the abdomen and pelvis was performed using the standard protocol following bolus administration of intravenous contrast. CONTRAST:  137mL ISOVUE-300 IOPAMIDOL (ISOVUE-300) INJECTION 61% COMPARISON:  Chest CT 08/23/2018. No prior dedicated abdominopelvic imaging. FINDINGS: Lower chest: Clear lung bases. A small fat containing right-sided Bochdalek's hernia. Normal heart size without pericardial or pleural effusion. Tiny hiatal hernia. Hepatobiliary: Nonspecific caudate and lateral segment left liver lobe prominence. Focal steatosis adjacent the falciform ligament. Lateral segment left liver lobe cyst. Stone within the gallbladder neck at 6 mm. No acute inflammation or biliary duct dilatation. Pancreas: Normal, without mass or ductal dilatation. Spleen: Normal in size, without focal abnormality. Adrenals/Urinary Tract: Normal adrenal glands. Interpolar left renal 1.3 cm collecting system calculus. Interpolar left renal 1.0 cm lesion is favored to represent a cyst or minimally complex cyst, given density. There also left renal too small to characterize lesions. No hydronephrosis. Stomach/Bowel: Proximal gastric underdistention. Normal distal stomach. Colonic stool burden suggests constipation. Normal terminal ileum. Normal small bowel. Vascular/Lymphatic: Aortic atherosclerosis. Left periaortic node or conglomerate of nodes measures 2.1 x 3.4 cm on 26/2. Retrocaval nodal conglomerate measures 2.1 by 3.6 cm on 24/2. Mildly enlarged retrocrural node at 7 mm on 15/2. Small bowel mesenteric adenopathy, including at 1.9 cm on 28/2.  No pelvic sidewall adenopathy. Reproductive: Hysterectomy.  No adnexal mass. Other: No significant free fluid. Musculoskeletal: Tarlov cysts. IMPRESSION: 1. Extensive abdominal adenopathy, highly suspicious for lymphoproliferative disorder. Consider either tissue sampling  or PET to direct tissue sampling. 2. Borderline right retrocrural adenopathy, suspicious. 3. Left nephrolithiasis. 4.  Aortic Atherosclerosis (ICD10-I70.0). 5. Cholelithiasis. Electronically Signed   By: Abigail Miyamoto M.D.   On: 09/13/2018 14:47   Nm Pet Image Initial (pi) Skull Base To Thigh  Result Date: 09/23/2018 CLINICAL DATA:  Initial treatment strategy for thoracic and abdominal adenopathy suspicious for lymphoproliferative disorder. EXAM: NUCLEAR MEDICINE PET SKULL BASE TO THIGH TECHNIQUE: 8.6 mCi F-18 FDG was injected intravenously. Full-ring PET imaging was performed from the skull base to thigh after the radiotracer. CT data was obtained and used for attenuation correction and anatomic localization. Fasting blood glucose: 112 mg/dl COMPARISON:  CT examinations from 08/23/2018 and 09/13/2018 FINDINGS: Mediastinal blood pool activity: SUV max 2.6 Liver activity: SUV max 3.6 NECK: Symmetric palatine tonsillar activity is likely physiologic. Left level IV lymph node measuring 1.1 cm in short axis on image 40/4, maximum SUV 7.3 (Deauville 4). Incidental CT findings: none CHEST: Left supraclavicular node 0.8 cm in short axis on image 46/4, maximum SUV 7.6 (Deauville 4). Paraesophageal lymph node measuring 0.9 cm in short axis on image 65/4, maximum SUV 9.6. Another paraesophageal lymph node measuring 1.2 cm in short axis on image 78/4 has maximum SUV of 9.9. These are considered Deauville 5. Clustered nodularity and airspace opacity peripherally in the left upper lobe measuring about 3.4 by 1.5 cm on image 29/8, maximum SUV 5.1 (Deauville 4). Atelectasis in the lingula, maximum SUV 2.4. Accentuated activity in the distal esophagus, maximum SUV 5.1, most likely physiologic. Incidental CT findings: Scattered nonspecific small nodules in the lungs are not hypermetabolic but are below sensitive PET-CT size thresholds. There is some mild scarring in the right middle lobe. Atherosclerotic calcification of the  aortic arch. Bilateral breast implants. Emphysema. ABDOMEN/PELVIS: Pathologic and hypermetabolic retroperitoneal and mesenteric adenopathy in the upper abdomen. Conglomerate nodal tissue measures 4.2 by 2.5 cm in the left periaortic region on image 117/4, with maximum SUV 15.8, Deauville 5. An upper mesenteric lymph node measuring 1.9 cm in short axis on image 118/4 has a maximum SUV of 10.6, Deauville 5. Mild stranding in the mesentery noted. No splenomegaly or abnormal splenic activity. Minimal hypermetabolic right retrocrural adenopathy. No hypermetabolic pelvic adenopathy. Incidental CT findings: Cholelithiasis. Suspected cyst in the left hepatic lobe. 1.1 cm stone in the left mid kidney. Aortoiliac atherosclerotic vascular disease. SKELETON: No significant abnormal hypermetabolic activity in this region. Incidental CT findings: none IMPRESSION: 1. Deauville 5 adenopathy in the chest and upper abdomen with a left lower neck Deauville 4 lymph node, suspicious for active lymphoproliferative disease. 2. Clustered nodularity with mild airspace opacity in the left upper lobe, maximum SUV 5.1, possibly infectious, less likely due to lymphoproliferative infiltrate given the pattern. 3. Scattered small pulmonary nodules as on prior chest CT, not appreciably hypermetabolic but in general below sensitive PET-CT size thresholds. 4. Other imaging findings of potential clinical significance: Aortic Atherosclerosis (ICD10-I70.0) and Emphysema (ICD10-J43.9). Cholelithiasis. Left nephrolithiasis. Electronically Signed   By: Van Clines M.D.   On: 09/23/2018 17:10    Labs:  CBC: Recent Labs    09/30/18 0630  WBC 6.0  HGB 13.1  HCT 40.7  PLT 328    COAGS: Recent Labs    09/30/18 0630  INR 0.9    BMP: No results for  input(s): NA, K, CL, CO2, GLUCOSE, BUN, CALCIUM, CREATININE, GFRNONAA, GFRAA in the last 8760 hours.  Invalid input(s): CMP  LIVER FUNCTION TESTS: No results for input(s): BILITOT,  AST, ALT, ALKPHOS, PROT, ALBUMIN in the last 8760 hours.  TUMOR MARKERS: No results for input(s): AFPTM, CEA, CA199, CHROMGRNA in the last 8760 hours.  Assessment and Plan: Adenopathy For US guided bx left neck LN Labs ok Risks and benefits of LN bx was discussed with the patient and/or patient's family including, but not limited to bleeding, infection, damage to adjacent structures or low yield requiring additional tests.  All of the questions were answered and there is agreement to proceed.  Consent signed and in chart.    Thank you for this interesting consult.  I greatly enjoyed meeting Yolanda Copeland and look forward to participating in their care.  A copy of this report was sent to the requesting provider on this date.  Electronically Signed: Ascencion Dike, PA-C 09/30/2018, 7:47 AM   I spent a total of 20 minutes in face to face in clinical consultation, greater than 50% of which was counseling/coordinating care for LN bx

## 2018-10-14 ENCOUNTER — Other Ambulatory Visit: Payer: Self-pay

## 2018-10-14 ENCOUNTER — Ambulatory Visit
Admission: RE | Admit: 2018-10-14 | Discharge: 2018-10-14 | Disposition: A | Discharge status: Home / Self Care | Payer: Medicare Other | Source: Ambulatory Visit | Attending: Family Medicine | Admitting: Family Medicine

## 2018-10-14 DIAGNOSIS — Z1231 Encounter for screening mammogram for malignant neoplasm of breast: Secondary | ICD-10-CM

## 2018-10-24 ENCOUNTER — Encounter (HOSPITAL_COMMUNITY): Payer: Self-pay | Admitting: Interventional Radiology

## 2018-10-31 DIAGNOSIS — C8298 Follicular lymphoma, unspecified, lymph nodes of multiple sites: Secondary | ICD-10-CM | POA: Diagnosis not present

## 2018-10-31 DIAGNOSIS — R634 Abnormal weight loss: Secondary | ICD-10-CM | POA: Diagnosis not present

## 2018-10-31 DIAGNOSIS — A310 Pulmonary mycobacterial infection: Secondary | ICD-10-CM | POA: Diagnosis not present

## 2018-11-11 DIAGNOSIS — C8298 Follicular lymphoma, unspecified, lymph nodes of multiple sites: Secondary | ICD-10-CM | POA: Diagnosis not present

## 2018-11-11 DIAGNOSIS — R590 Localized enlarged lymph nodes: Secondary | ICD-10-CM | POA: Diagnosis not present

## 2018-11-16 DIAGNOSIS — H52221 Regular astigmatism, right eye: Secondary | ICD-10-CM | POA: Diagnosis not present

## 2018-11-16 DIAGNOSIS — H2513 Age-related nuclear cataract, bilateral: Secondary | ICD-10-CM | POA: Diagnosis not present

## 2018-11-16 DIAGNOSIS — H5203 Hypermetropia, bilateral: Secondary | ICD-10-CM | POA: Diagnosis not present

## 2018-11-16 DIAGNOSIS — H524 Presbyopia: Secondary | ICD-10-CM | POA: Diagnosis not present

## 2018-11-16 DIAGNOSIS — H53022 Refractive amblyopia, left eye: Secondary | ICD-10-CM | POA: Diagnosis not present

## 2018-11-16 DIAGNOSIS — H40033 Anatomical narrow angle, bilateral: Secondary | ICD-10-CM | POA: Diagnosis not present

## 2018-11-16 DIAGNOSIS — H40053 Ocular hypertension, bilateral: Secondary | ICD-10-CM | POA: Diagnosis not present

## 2018-11-16 DIAGNOSIS — H04123 Dry eye syndrome of bilateral lacrimal glands: Secondary | ICD-10-CM | POA: Diagnosis not present

## 2018-11-16 DIAGNOSIS — H43813 Vitreous degeneration, bilateral: Secondary | ICD-10-CM | POA: Diagnosis not present

## 2018-11-21 DIAGNOSIS — C8298 Follicular lymphoma, unspecified, lymph nodes of multiple sites: Secondary | ICD-10-CM | POA: Diagnosis not present

## 2018-11-21 DIAGNOSIS — C8290 Follicular lymphoma, unspecified, unspecified site: Secondary | ICD-10-CM | POA: Insufficient documentation

## 2018-11-28 DIAGNOSIS — J479 Bronchiectasis, uncomplicated: Secondary | ICD-10-CM | POA: Diagnosis not present

## 2018-12-01 DIAGNOSIS — Z87891 Personal history of nicotine dependence: Secondary | ICD-10-CM | POA: Diagnosis not present

## 2018-12-01 DIAGNOSIS — T50905A Adverse effect of unspecified drugs, medicaments and biological substances, initial encounter: Secondary | ICD-10-CM | POA: Diagnosis not present

## 2018-12-01 DIAGNOSIS — I10 Essential (primary) hypertension: Secondary | ICD-10-CM | POA: Diagnosis not present

## 2018-12-01 DIAGNOSIS — Z5112 Encounter for antineoplastic immunotherapy: Secondary | ICD-10-CM | POA: Diagnosis not present

## 2018-12-01 DIAGNOSIS — J392 Other diseases of pharynx: Secondary | ICD-10-CM | POA: Diagnosis not present

## 2018-12-01 DIAGNOSIS — F064 Anxiety disorder due to known physiological condition: Secondary | ICD-10-CM | POA: Diagnosis not present

## 2018-12-01 DIAGNOSIS — J029 Acute pharyngitis, unspecified: Secondary | ICD-10-CM | POA: Diagnosis not present

## 2018-12-01 DIAGNOSIS — R634 Abnormal weight loss: Secondary | ICD-10-CM | POA: Diagnosis not present

## 2018-12-01 DIAGNOSIS — T451X5A Adverse effect of antineoplastic and immunosuppressive drugs, initial encounter: Secondary | ICD-10-CM | POA: Diagnosis not present

## 2018-12-01 DIAGNOSIS — C8290 Follicular lymphoma, unspecified, unspecified site: Secondary | ICD-10-CM | POA: Diagnosis not present

## 2018-12-01 DIAGNOSIS — C8291 Follicular lymphoma, unspecified, lymph nodes of head, face, and neck: Secondary | ICD-10-CM | POA: Diagnosis not present

## 2018-12-08 DIAGNOSIS — C8290 Follicular lymphoma, unspecified, unspecified site: Secondary | ICD-10-CM | POA: Diagnosis not present

## 2018-12-08 DIAGNOSIS — Z5112 Encounter for antineoplastic immunotherapy: Secondary | ICD-10-CM | POA: Diagnosis not present

## 2018-12-15 DIAGNOSIS — Z87891 Personal history of nicotine dependence: Secondary | ICD-10-CM | POA: Diagnosis not present

## 2018-12-15 DIAGNOSIS — Z5112 Encounter for antineoplastic immunotherapy: Secondary | ICD-10-CM | POA: Diagnosis not present

## 2018-12-15 DIAGNOSIS — C8292 Follicular lymphoma, unspecified, intrathoracic lymph nodes: Secondary | ICD-10-CM | POA: Diagnosis not present

## 2018-12-15 DIAGNOSIS — R634 Abnormal weight loss: Secondary | ICD-10-CM | POA: Diagnosis not present

## 2018-12-15 DIAGNOSIS — C8290 Follicular lymphoma, unspecified, unspecified site: Secondary | ICD-10-CM | POA: Diagnosis not present

## 2018-12-15 DIAGNOSIS — F064 Anxiety disorder due to known physiological condition: Secondary | ICD-10-CM | POA: Diagnosis not present

## 2018-12-21 DIAGNOSIS — Z5112 Encounter for antineoplastic immunotherapy: Secondary | ICD-10-CM | POA: Diagnosis not present

## 2018-12-21 DIAGNOSIS — C8290 Follicular lymphoma, unspecified, unspecified site: Secondary | ICD-10-CM | POA: Diagnosis not present

## 2019-01-12 DIAGNOSIS — C8213 Follicular lymphoma grade II, intra-abdominal lymph nodes: Secondary | ICD-10-CM | POA: Diagnosis not present

## 2019-01-12 DIAGNOSIS — C8290 Follicular lymphoma, unspecified, unspecified site: Secondary | ICD-10-CM | POA: Diagnosis not present

## 2019-01-26 DIAGNOSIS — Z5112 Encounter for antineoplastic immunotherapy: Secondary | ICD-10-CM | POA: Diagnosis not present

## 2019-01-26 DIAGNOSIS — C8290 Follicular lymphoma, unspecified, unspecified site: Secondary | ICD-10-CM | POA: Diagnosis not present

## 2019-02-02 DIAGNOSIS — C8290 Follicular lymphoma, unspecified, unspecified site: Secondary | ICD-10-CM | POA: Diagnosis not present

## 2019-02-02 DIAGNOSIS — Z5112 Encounter for antineoplastic immunotherapy: Secondary | ICD-10-CM | POA: Diagnosis not present

## 2019-02-09 DIAGNOSIS — Z5112 Encounter for antineoplastic immunotherapy: Secondary | ICD-10-CM | POA: Diagnosis not present

## 2019-02-09 DIAGNOSIS — C8290 Follicular lymphoma, unspecified, unspecified site: Secondary | ICD-10-CM | POA: Diagnosis not present

## 2019-02-16 DIAGNOSIS — Z5112 Encounter for antineoplastic immunotherapy: Secondary | ICD-10-CM | POA: Diagnosis not present

## 2019-02-16 DIAGNOSIS — C8290 Follicular lymphoma, unspecified, unspecified site: Secondary | ICD-10-CM | POA: Diagnosis not present

## 2019-02-22 DIAGNOSIS — C8290 Follicular lymphoma, unspecified, unspecified site: Secondary | ICD-10-CM | POA: Diagnosis not present

## 2019-02-22 DIAGNOSIS — C8213 Follicular lymphoma grade II, intra-abdominal lymph nodes: Secondary | ICD-10-CM | POA: Diagnosis not present

## 2019-02-23 DIAGNOSIS — C8290 Follicular lymphoma, unspecified, unspecified site: Secondary | ICD-10-CM | POA: Diagnosis not present

## 2019-02-24 ENCOUNTER — Ambulatory Visit: Payer: Medicare Other

## 2019-02-27 ENCOUNTER — Encounter: Payer: Self-pay | Admitting: Internal Medicine

## 2019-02-27 DIAGNOSIS — C8290 Follicular lymphoma, unspecified, unspecified site: Secondary | ICD-10-CM | POA: Insufficient documentation

## 2019-03-04 ENCOUNTER — Ambulatory Visit: Payer: Medicare Other | Attending: Internal Medicine

## 2019-03-04 DIAGNOSIS — Z23 Encounter for immunization: Secondary | ICD-10-CM

## 2019-03-04 NOTE — Progress Notes (Signed)
   Covid-19 Vaccination Clinic  Name:  Yolanda Copeland    MRN: [id number] DOB: 1950-12-23  03/04/2019  [name] was observed post Covid-19 immunization for 15 minutes without incidence. She was provided with Vaccine Information Sheet and instruction to access the V-Safe system.   [name] was instructed to call 911 with any severe reactions post vaccine: Marland Kitchen Difficulty breathing  . Swelling of your face and throat  . A fast heartbeat  . A bad rash all over your body  . Dizziness and weakness    Immunizations Administered    Name Date Dose VIS Date Route   Pfizer COVID-19 Vaccine 03/04/2019  4:54 PM 0.3 mL 01/06/2019 Intramuscular   Manufacturer: [place]   Lot: [id number]   [place]: [id number]

## 2019-03-07 ENCOUNTER — Ambulatory Visit: Payer: Medicare Other

## 2019-03-23 DIAGNOSIS — C8290 Follicular lymphoma, unspecified, unspecified site: Secondary | ICD-10-CM | POA: Diagnosis not present

## 2019-03-29 ENCOUNTER — Ambulatory Visit: Payer: Medicare Other

## 2019-03-29 ENCOUNTER — Ambulatory Visit: Payer: Medicare Other | Attending: Internal Medicine

## 2019-03-29 DIAGNOSIS — Z23 Encounter for immunization: Secondary | ICD-10-CM

## 2019-03-29 NOTE — Progress Notes (Signed)
   Covid-19 Vaccination Clinic  Name:  Yolanda Copeland    MRN: [id number] DOB: [birth year]  03/29/2019  [name] was observed post Covid-19 immunization for 15 minutes without incident. She was provided with Vaccine Information Sheet and instruction to access the V-Safe system.   [name] was instructed to call 911 with any severe reactions post vaccine: Marland Kitchen Difficulty breathing  . Swelling of face and throat  . A fast heartbeat  . A bad rash all over body  . Dizziness and weakness   Immunizations Administered    Name Date Dose VIS Date Route   Pfizer COVID-19 Vaccine 03/29/2019  8:30 AM 0.3 mL 01/06/2019 Intramuscular   Manufacturer: [place]   Lot: [id number]   [place]: [id number]

## 2019-04-06 DIAGNOSIS — Z5112 Encounter for antineoplastic immunotherapy: Secondary | ICD-10-CM | POA: Diagnosis not present

## 2019-04-06 DIAGNOSIS — C8290 Follicular lymphoma, unspecified, unspecified site: Secondary | ICD-10-CM | POA: Diagnosis not present

## 2019-05-04 DIAGNOSIS — C8293 Follicular lymphoma, unspecified, intra-abdominal lymph nodes: Secondary | ICD-10-CM | POA: Diagnosis not present

## 2019-05-04 DIAGNOSIS — D709 Neutropenia, unspecified: Secondary | ICD-10-CM | POA: Diagnosis not present

## 2019-05-04 DIAGNOSIS — R5383 Other fatigue: Secondary | ICD-10-CM | POA: Diagnosis not present

## 2019-05-04 DIAGNOSIS — G4700 Insomnia, unspecified: Secondary | ICD-10-CM | POA: Diagnosis not present

## 2019-05-04 DIAGNOSIS — Z87891 Personal history of nicotine dependence: Secondary | ICD-10-CM | POA: Diagnosis not present

## 2019-05-04 DIAGNOSIS — R0609 Other forms of dyspnea: Secondary | ICD-10-CM | POA: Diagnosis not present

## 2019-05-04 DIAGNOSIS — Z5112 Encounter for antineoplastic immunotherapy: Secondary | ICD-10-CM | POA: Diagnosis not present

## 2019-05-04 DIAGNOSIS — Z7982 Long term (current) use of aspirin: Secondary | ICD-10-CM | POA: Diagnosis not present

## 2019-05-04 DIAGNOSIS — Z79899 Other long term (current) drug therapy: Secondary | ICD-10-CM | POA: Diagnosis not present

## 2019-05-04 DIAGNOSIS — C8290 Follicular lymphoma, unspecified, unspecified site: Secondary | ICD-10-CM | POA: Diagnosis not present

## 2019-05-21 DIAGNOSIS — Z03818 Encounter for observation for suspected exposure to other biological agents ruled out: Secondary | ICD-10-CM | POA: Diagnosis not present

## 2019-05-21 DIAGNOSIS — Z20828 Contact with and (suspected) exposure to other viral communicable diseases: Secondary | ICD-10-CM | POA: Diagnosis not present

## 2019-05-30 DIAGNOSIS — M810 Age-related osteoporosis without current pathological fracture: Secondary | ICD-10-CM | POA: Diagnosis not present

## 2019-06-01 DIAGNOSIS — Q211 Atrial septal defect: Secondary | ICD-10-CM | POA: Diagnosis not present

## 2019-06-01 DIAGNOSIS — D702 Other drug-induced agranulocytosis: Secondary | ICD-10-CM | POA: Diagnosis not present

## 2019-06-01 DIAGNOSIS — C8292 Follicular lymphoma, unspecified, intrathoracic lymph nodes: Secondary | ICD-10-CM | POA: Diagnosis not present

## 2019-06-01 DIAGNOSIS — N200 Calculus of kidney: Secondary | ICD-10-CM | POA: Diagnosis not present

## 2019-06-01 DIAGNOSIS — Z5112 Encounter for antineoplastic immunotherapy: Secondary | ICD-10-CM | POA: Diagnosis not present

## 2019-06-01 DIAGNOSIS — K8020 Calculus of gallbladder without cholecystitis without obstruction: Secondary | ICD-10-CM | POA: Diagnosis not present

## 2019-06-01 DIAGNOSIS — T50995A Adverse effect of other drugs, medicaments and biological substances, initial encounter: Secondary | ICD-10-CM | POA: Diagnosis not present

## 2019-06-01 DIAGNOSIS — J432 Centrilobular emphysema: Secondary | ICD-10-CM | POA: Diagnosis not present

## 2019-06-01 DIAGNOSIS — C8290 Follicular lymphoma, unspecified, unspecified site: Secondary | ICD-10-CM | POA: Diagnosis not present

## 2019-06-25 DIAGNOSIS — N390 Urinary tract infection, site not specified: Secondary | ICD-10-CM | POA: Diagnosis not present

## 2019-06-28 DIAGNOSIS — C8290 Follicular lymphoma, unspecified, unspecified site: Secondary | ICD-10-CM | POA: Diagnosis not present

## 2019-06-28 DIAGNOSIS — R918 Other nonspecific abnormal finding of lung field: Secondary | ICD-10-CM | POA: Diagnosis not present

## 2019-06-29 DIAGNOSIS — C8222 Follicular lymphoma grade III, unspecified, intrathoracic lymph nodes: Secondary | ICD-10-CM | POA: Diagnosis not present

## 2019-06-29 DIAGNOSIS — C8290 Follicular lymphoma, unspecified, unspecified site: Secondary | ICD-10-CM | POA: Diagnosis not present

## 2019-06-29 DIAGNOSIS — Z7982 Long term (current) use of aspirin: Secondary | ICD-10-CM | POA: Diagnosis not present

## 2019-06-29 DIAGNOSIS — Z87891 Personal history of nicotine dependence: Secondary | ICD-10-CM | POA: Diagnosis not present

## 2019-06-29 DIAGNOSIS — N390 Urinary tract infection, site not specified: Secondary | ICD-10-CM | POA: Diagnosis not present

## 2019-06-29 DIAGNOSIS — D709 Neutropenia, unspecified: Secondary | ICD-10-CM | POA: Diagnosis not present

## 2019-06-29 DIAGNOSIS — Z5111 Encounter for antineoplastic chemotherapy: Secondary | ICD-10-CM | POA: Diagnosis not present

## 2019-06-30 DIAGNOSIS — N390 Urinary tract infection, site not specified: Secondary | ICD-10-CM | POA: Diagnosis not present

## 2019-07-24 DIAGNOSIS — Z86010 Personal history of colonic polyps: Secondary | ICD-10-CM | POA: Diagnosis not present

## 2019-07-24 DIAGNOSIS — R197 Diarrhea, unspecified: Secondary | ICD-10-CM | POA: Diagnosis not present

## 2019-07-27 DIAGNOSIS — Z79899 Other long term (current) drug therapy: Secondary | ICD-10-CM | POA: Diagnosis not present

## 2019-07-27 DIAGNOSIS — R5382 Chronic fatigue, unspecified: Secondary | ICD-10-CM | POA: Diagnosis not present

## 2019-07-27 DIAGNOSIS — C8290 Follicular lymphoma, unspecified, unspecified site: Secondary | ICD-10-CM | POA: Diagnosis not present

## 2019-07-27 DIAGNOSIS — Z5112 Encounter for antineoplastic immunotherapy: Secondary | ICD-10-CM | POA: Diagnosis not present

## 2019-07-27 DIAGNOSIS — Z87891 Personal history of nicotine dependence: Secondary | ICD-10-CM | POA: Diagnosis not present

## 2019-07-27 DIAGNOSIS — C8292 Follicular lymphoma, unspecified, intrathoracic lymph nodes: Secondary | ICD-10-CM | POA: Diagnosis not present

## 2019-07-28 DIAGNOSIS — R197 Diarrhea, unspecified: Secondary | ICD-10-CM | POA: Diagnosis not present

## 2019-08-09 DIAGNOSIS — R197 Diarrhea, unspecified: Secondary | ICD-10-CM | POA: Diagnosis not present

## 2019-08-09 DIAGNOSIS — K6289 Other specified diseases of anus and rectum: Secondary | ICD-10-CM | POA: Diagnosis not present

## 2019-08-09 DIAGNOSIS — K5289 Other specified noninfective gastroenteritis and colitis: Secondary | ICD-10-CM | POA: Diagnosis not present

## 2019-08-09 DIAGNOSIS — K6389 Other specified diseases of intestine: Secondary | ICD-10-CM | POA: Diagnosis not present

## 2019-08-15 DIAGNOSIS — K5289 Other specified noninfective gastroenteritis and colitis: Secondary | ICD-10-CM | POA: Diagnosis not present

## 2019-08-16 ENCOUNTER — Other Ambulatory Visit: Payer: Self-pay | Admitting: Pulmonary Disease

## 2019-08-16 DIAGNOSIS — R059 Cough, unspecified: Secondary | ICD-10-CM

## 2019-08-31 DIAGNOSIS — R197 Diarrhea, unspecified: Secondary | ICD-10-CM | POA: Diagnosis not present

## 2019-09-05 DIAGNOSIS — D2272 Melanocytic nevi of left lower limb, including hip: Secondary | ICD-10-CM | POA: Diagnosis not present

## 2019-09-05 DIAGNOSIS — L812 Freckles: Secondary | ICD-10-CM | POA: Diagnosis not present

## 2019-09-05 DIAGNOSIS — D485 Neoplasm of uncertain behavior of skin: Secondary | ICD-10-CM | POA: Diagnosis not present

## 2019-09-05 DIAGNOSIS — L821 Other seborrheic keratosis: Secondary | ICD-10-CM | POA: Diagnosis not present

## 2019-09-05 DIAGNOSIS — L820 Inflamed seborrheic keratosis: Secondary | ICD-10-CM | POA: Diagnosis not present

## 2019-09-05 DIAGNOSIS — L718 Other rosacea: Secondary | ICD-10-CM | POA: Diagnosis not present

## 2019-09-05 DIAGNOSIS — L738 Other specified follicular disorders: Secondary | ICD-10-CM | POA: Diagnosis not present

## 2019-09-05 DIAGNOSIS — D2372 Other benign neoplasm of skin of left lower limb, including hip: Secondary | ICD-10-CM | POA: Diagnosis not present

## 2019-09-06 DIAGNOSIS — E7800 Pure hypercholesterolemia, unspecified: Secondary | ICD-10-CM | POA: Diagnosis not present

## 2019-09-06 DIAGNOSIS — R7309 Other abnormal glucose: Secondary | ICD-10-CM | POA: Diagnosis not present

## 2019-09-06 DIAGNOSIS — I10 Essential (primary) hypertension: Secondary | ICD-10-CM | POA: Diagnosis not present

## 2019-09-06 DIAGNOSIS — M810 Age-related osteoporosis without current pathological fracture: Secondary | ICD-10-CM | POA: Diagnosis not present

## 2019-09-06 DIAGNOSIS — R7989 Other specified abnormal findings of blood chemistry: Secondary | ICD-10-CM | POA: Diagnosis not present

## 2019-09-20 DIAGNOSIS — Z0000 Encounter for general adult medical examination without abnormal findings: Secondary | ICD-10-CM | POA: Diagnosis not present

## 2019-09-20 DIAGNOSIS — F325 Major depressive disorder, single episode, in full remission: Secondary | ICD-10-CM | POA: Diagnosis not present

## 2019-09-20 DIAGNOSIS — Z23 Encounter for immunization: Secondary | ICD-10-CM | POA: Diagnosis not present

## 2019-09-20 DIAGNOSIS — E7800 Pure hypercholesterolemia, unspecified: Secondary | ICD-10-CM | POA: Diagnosis not present

## 2019-09-20 DIAGNOSIS — I10 Essential (primary) hypertension: Secondary | ICD-10-CM | POA: Diagnosis not present

## 2019-09-20 DIAGNOSIS — K219 Gastro-esophageal reflux disease without esophagitis: Secondary | ICD-10-CM | POA: Diagnosis not present

## 2019-09-20 DIAGNOSIS — J439 Emphysema, unspecified: Secondary | ICD-10-CM | POA: Diagnosis not present

## 2019-09-20 DIAGNOSIS — R946 Abnormal results of thyroid function studies: Secondary | ICD-10-CM | POA: Diagnosis not present

## 2019-09-20 DIAGNOSIS — F419 Anxiety disorder, unspecified: Secondary | ICD-10-CM | POA: Diagnosis not present

## 2019-09-20 DIAGNOSIS — M810 Age-related osteoporosis without current pathological fracture: Secondary | ICD-10-CM | POA: Diagnosis not present

## 2019-09-20 DIAGNOSIS — E559 Vitamin D deficiency, unspecified: Secondary | ICD-10-CM | POA: Diagnosis not present

## 2019-09-20 DIAGNOSIS — C8203 Follicular lymphoma grade I, intra-abdominal lymph nodes: Secondary | ICD-10-CM | POA: Diagnosis not present

## 2019-10-09 ENCOUNTER — Ambulatory Visit
Admission: RE | Admit: 2019-10-09 | Discharge: 2019-10-09 | Disposition: A | Discharge status: Home / Self Care | Payer: Medicare Other | Source: Ambulatory Visit | Attending: Acute Care | Admitting: Acute Care

## 2019-10-09 DIAGNOSIS — J479 Bronchiectasis, uncomplicated: Secondary | ICD-10-CM | POA: Diagnosis not present

## 2019-10-09 DIAGNOSIS — I700 Atherosclerosis of aorta: Secondary | ICD-10-CM | POA: Diagnosis not present

## 2019-10-09 DIAGNOSIS — Z87891 Personal history of nicotine dependence: Secondary | ICD-10-CM

## 2019-10-09 DIAGNOSIS — Z122 Encounter for screening for malignant neoplasm of respiratory organs: Secondary | ICD-10-CM

## 2019-10-09 DIAGNOSIS — J432 Centrilobular emphysema: Secondary | ICD-10-CM | POA: Diagnosis not present

## 2019-10-12 NOTE — Progress Notes (Signed)
I have called the patient with the results of her low-dose CT.  Her scan was read as a lung RADS 4B as a technicality based on notation of widespread areas of cylindrical bronchiectasis, thickening of the peribronchovascular interstitium, and regional architectural distortion and worsening of the peribronchovascular micro and macro nodularity, increased compared to prior study.  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  This does appear progressive from previous studies.  Recommendation per radiology is for a 54-month follow-up after course of antimicrobial therapy. 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  She has been treated at [facility] by oncologist Dr. Rogers Blocker, and by ID physician Dr. Clenton Pare.  She was treated with rituximab infusions, last infusion 07/27/2019.  She is currently taking Revlimid 10 days out of every 28.  Due to this ongoing chemotherapy patient is immunocompromised and this may be the reason for progression of suspected MAI.  The patient has been proactive and she has contacted her ID doctor at [facility] to discuss this with him further.  I did explain to the patient that due to the fact she is actively on chemotherapy and has an active cancer that we will no longer be able to screen her through the screening program.  However we have agreed that she does need a 25-month follow-up after antimicrobial treatment when she has discussed this with her oncologist and her infectious disease physician at [facility]. Patient is going to follow-up with [facility], she suspects they will recommend that she follows up with [facility] as she has been seen by them in the past (saw Dr. Lake Bells 02/14/2018).  She states she will let me know who will be doing the 48-month follow-up scan, her physicians at [facility], or a referral to a physician here at [facility] pulmonary.  Patient understands that this  follow-up CT does need to be done in order to maintain surveillance of the area noted in the low-dose CT.  She will call the office to let me know what she learns from her [place] based physicians. Yolanda Copeland, please put patient on tickler list so that we can reevaluate who is going to be scanning her at the 17-month interval.  While I know it will not be the screening program , I do want to make sure she does not fall through the cracks.  Thanks so much

## 2019-10-18 DIAGNOSIS — R0600 Dyspnea, unspecified: Secondary | ICD-10-CM | POA: Diagnosis not present

## 2019-10-18 DIAGNOSIS — J479 Bronchiectasis, uncomplicated: Secondary | ICD-10-CM | POA: Diagnosis not present

## 2019-10-19 DIAGNOSIS — N390 Urinary tract infection, site not specified: Secondary | ICD-10-CM | POA: Diagnosis not present

## 2019-11-02 DIAGNOSIS — C8290 Follicular lymphoma, unspecified, unspecified site: Secondary | ICD-10-CM | POA: Diagnosis not present

## 2019-11-05 DIAGNOSIS — Z23 Encounter for immunization: Secondary | ICD-10-CM | POA: Diagnosis not present

## 2019-11-06 ENCOUNTER — Other Ambulatory Visit: Payer: Self-pay | Admitting: Family Medicine

## 2019-11-06 DIAGNOSIS — Z1231 Encounter for screening mammogram for malignant neoplasm of breast: Secondary | ICD-10-CM

## 2019-11-17 DIAGNOSIS — H2513 Age-related nuclear cataract, bilateral: Secondary | ICD-10-CM | POA: Diagnosis not present

## 2019-11-17 DIAGNOSIS — H40053 Ocular hypertension, bilateral: Secondary | ICD-10-CM | POA: Diagnosis not present

## 2019-11-17 DIAGNOSIS — H43813 Vitreous degeneration, bilateral: Secondary | ICD-10-CM | POA: Diagnosis not present

## 2019-11-17 DIAGNOSIS — H40033 Anatomical narrow angle, bilateral: Secondary | ICD-10-CM | POA: Diagnosis not present

## 2019-11-18 DIAGNOSIS — H40053 Ocular hypertension, bilateral: Secondary | ICD-10-CM | POA: Insufficient documentation

## 2019-11-20 DIAGNOSIS — J479 Bronchiectasis, uncomplicated: Secondary | ICD-10-CM | POA: Diagnosis not present

## 2019-11-28 ENCOUNTER — Ambulatory Visit
Admission: RE | Admit: 2019-11-28 | Discharge: 2019-11-28 | Disposition: A | Discharge status: Home / Self Care | Payer: Medicare Other | Source: Ambulatory Visit | Attending: Family Medicine | Admitting: Family Medicine

## 2019-11-28 ENCOUNTER — Other Ambulatory Visit: Payer: Self-pay

## 2019-11-28 DIAGNOSIS — Z1231 Encounter for screening mammogram for malignant neoplasm of breast: Secondary | ICD-10-CM

## 2019-12-05 DIAGNOSIS — M810 Age-related osteoporosis without current pathological fracture: Secondary | ICD-10-CM | POA: Diagnosis not present

## 2019-12-08 DIAGNOSIS — Z03818 Encounter for observation for suspected exposure to other biological agents ruled out: Secondary | ICD-10-CM | POA: Diagnosis not present

## 2019-12-08 DIAGNOSIS — Z20822 Contact with and (suspected) exposure to covid-19: Secondary | ICD-10-CM | POA: Diagnosis not present

## 2019-12-20 DIAGNOSIS — J479 Bronchiectasis, uncomplicated: Secondary | ICD-10-CM | POA: Diagnosis not present

## 2019-12-25 DIAGNOSIS — Z23 Encounter for immunization: Secondary | ICD-10-CM | POA: Diagnosis not present

## 2020-02-01 DIAGNOSIS — C8228 Follicular lymphoma grade III, unspecified, lymph nodes of multiple sites: Secondary | ICD-10-CM | POA: Diagnosis not present

## 2020-02-01 DIAGNOSIS — Z23 Encounter for immunization: Secondary | ICD-10-CM | POA: Diagnosis not present

## 2020-02-01 DIAGNOSIS — Z87891 Personal history of nicotine dependence: Secondary | ICD-10-CM | POA: Diagnosis not present

## 2020-02-01 DIAGNOSIS — C8290 Follicular lymphoma, unspecified, unspecified site: Secondary | ICD-10-CM | POA: Diagnosis not present

## 2020-02-08 ENCOUNTER — Other Ambulatory Visit: Payer: Medicare Other

## 2020-02-08 DIAGNOSIS — Z20822 Contact with and (suspected) exposure to covid-19: Secondary | ICD-10-CM | POA: Diagnosis not present

## 2020-02-10 LAB — NOVEL CORONAVIRUS, NAA: SARS-CoV-2, NAA: DETECTED — AB

## 2020-02-10 LAB — SARS-COV-2, NAA 2 DAY TAT

## 2020-02-12 ENCOUNTER — Telehealth: Payer: Self-pay | Admitting: Infectious Diseases

## 2020-02-12 NOTE — Telephone Encounter (Signed)
Called to discuss with patient about COVID-19 symptoms and the use of one of the available treatments for those with mild to moderate Covid symptoms and at a high risk of hospitalization.  Pt appears to qualify for outpatient treatment due to co-morbid conditions and/or a member of an at-risk group in accordance with the FDA Emergency Use Authorization.   Symptom onset:  Vaccinated: Baggs last dose in March 2021 Booster? Yes and has been receiving Evusheld Immunocompromised? yes Qualifiers:   Feels pretty well overall and no symptoms now. Her husband however has all the classic symptoms and would like consideration for treatment. Will send him a mychart.   Yolanda Madeira, NP

## 2020-02-13 ENCOUNTER — Other Ambulatory Visit: Payer: Self-pay | Admitting: Nurse Practitioner

## 2020-02-13 ENCOUNTER — Ambulatory Visit (HOSPITAL_COMMUNITY)
Admission: RE | Admit: 2020-02-13 | Discharge: 2020-02-13 | Disposition: A | Discharge status: Home / Self Care | Payer: Medicare Other | Source: Ambulatory Visit | Attending: Pulmonary Disease | Admitting: Pulmonary Disease

## 2020-02-13 ENCOUNTER — Encounter: Payer: Self-pay | Admitting: Nurse Practitioner

## 2020-02-13 DIAGNOSIS — C8290 Follicular lymphoma, unspecified, unspecified site: Secondary | ICD-10-CM | POA: Insufficient documentation

## 2020-02-13 DIAGNOSIS — I10 Essential (primary) hypertension: Secondary | ICD-10-CM

## 2020-02-13 DIAGNOSIS — U071 COVID-19: Secondary | ICD-10-CM

## 2020-02-13 MED ORDER — METHYLPREDNISOLONE SODIUM SUCC 125 MG IJ SOLR: 125.0000 mg | Freq: Once | INTRAMUSCULAR | Status: DC | PRN

## 2020-02-13 MED ORDER — SOTROVIMAB 500 MG/8ML IV SOLN
500.0000 mg | Freq: Once | INTRAVENOUS | Status: AC
  Administered 2020-02-13: 500 mg via INTRAVENOUS

## 2020-02-13 MED ORDER — SODIUM CHLORIDE 0.9 % IV SOLN: INTRAVENOUS | Status: DC | PRN

## 2020-02-13 MED ORDER — DIPHENHYDRAMINE HCL 50 MG/ML IJ SOLN: 50.0000 mg | Freq: Once | INTRAMUSCULAR | Status: DC | PRN

## 2020-02-13 MED ORDER — FAMOTIDINE IN NACL 20-0.9 MG/50ML-% IV SOLN: 20.0000 mg | Freq: Once | INTRAVENOUS | Status: DC | PRN

## 2020-02-13 MED ORDER — ALBUTEROL SULFATE HFA 108 (90 BASE) MCG/ACT IN AERS: 2.0000 | INHALATION_SPRAY | Freq: Once | RESPIRATORY_TRACT | Status: DC | PRN

## 2020-02-13 MED ORDER — EPINEPHRINE 0.3 MG/0.3ML IJ SOAJ: 0.3000 mg | Freq: Once | INTRAMUSCULAR | Status: DC | PRN

## 2020-02-13 NOTE — Progress Notes (Signed)
I connected by phone with Yolanda Copeland on 02/13/2020 at 11:25 AM to discuss the potential use of a new treatment for mild to moderate COVID-19 viral infection in non-hospitalized patients.  This patient is a 70 y.o. female that meets the FDA criteria for Emergency Use Authorization of COVID monoclonal antibody sotrovimab.  Has a (+) direct SARS-CoV-2 viral test result  Has mild or moderate COVID-19   Is NOT hospitalized due to COVID-19  Is within 10 days of symptom onset  Has at least one of the high risk factor(s) for progression to severe COVID-19 and/or hospitalization as defined in EUA.  Specific high risk criteria : Older age (>/= 68 yo), BMI > 25, Immunosuppressive Disease or Treatment and Cardiovascular disease or hypertension   I have spoken and communicated the following to the patient or parent/caregiver regarding COVID monoclonal antibody treatment:  1. FDA has authorized the emergency use for the treatment of mild to moderate COVID-19 in adults and pediatric patients with positive results of direct SARS-CoV-2 viral testing who are 65 years of age and older weighing at least 40 kg, and who are at high risk for progressing to severe COVID-19 and/or hospitalization.  2. The significant known and potential risks and benefits of COVID monoclonal antibody, and the extent to which such potential risks and benefits are unknown.  3. Information on available alternative treatments and the risks and benefits of those alternatives, including clinical trials.  4. Patients treated with COVID monoclonal antibody should continue to self-isolate and use infection control measures (e.g., wear mask, isolate, social distance, avoid sharing personal items, clean and disinfect "high touch" surfaces, and frequent handwashing) according to CDC guidelines.   5. The patient or parent/caregiver has the option to accept or refuse COVID monoclonal antibody treatment.  After reviewing this information  with the patient, the patient has agreed to receive one of the available covid 19 monoclonal antibodies and will be provided an appropriate fact sheet prior to infusion. Orma Render, NP 02/13/2020 11:25 AM

## 2020-02-13 NOTE — Discharge Instructions (Signed)

## 2020-02-13 NOTE — Progress Notes (Signed)
Diagnosis: COVID-19  Physician: Dr. Patrick Wright  Procedure: Covid Infusion Clinic Med: Sotrovimab infusion - Provided patient with sotrovimab fact sheet for patients, parents, and caregivers prior to infusion.   Complications: No immediate complications noted  Discharge: Discharged home    

## 2020-02-13 NOTE — Progress Notes (Signed)
Patient reviewed Fact Sheet for Patients, Parents, and Caregivers for Emergency Use Authorization (EUA) of sotrovimab for the Treatment of Coronavirus. Patient also reviewed and is agreeable to the estimated cost of treatment. Patient is agreeable to proceed.   

## 2020-02-29 DIAGNOSIS — M720 Palmar fascial fibromatosis [Dupuytren]: Secondary | ICD-10-CM | POA: Diagnosis not present

## 2020-03-06 DIAGNOSIS — R350 Frequency of micturition: Secondary | ICD-10-CM | POA: Diagnosis not present

## 2020-03-06 DIAGNOSIS — N390 Urinary tract infection, site not specified: Secondary | ICD-10-CM | POA: Diagnosis not present

## 2020-05-02 DIAGNOSIS — Z87891 Personal history of nicotine dependence: Secondary | ICD-10-CM | POA: Diagnosis not present

## 2020-05-02 DIAGNOSIS — Z8616 Personal history of COVID-19: Secondary | ICD-10-CM | POA: Diagnosis not present

## 2020-05-02 DIAGNOSIS — Z79899 Other long term (current) drug therapy: Secondary | ICD-10-CM | POA: Diagnosis not present

## 2020-05-02 DIAGNOSIS — C8288 Other types of follicular lymphoma, lymph nodes of multiple sites: Secondary | ICD-10-CM | POA: Diagnosis not present

## 2020-05-02 DIAGNOSIS — J432 Centrilobular emphysema: Secondary | ICD-10-CM | POA: Diagnosis not present

## 2020-05-02 DIAGNOSIS — C8290 Follicular lymphoma, unspecified, unspecified site: Secondary | ICD-10-CM | POA: Diagnosis not present

## 2020-05-13 IMAGING — MG MM  DIGITAL SCREENING BREAST BILAT IMPLANT W/ TOMO W/ CAD
8 of 12 series · 8 of 28 positions shown · non-contrast
Comparison: Previous exam(s).

CLINICAL DATA: Screening.

EXAM:
DIGITAL SCREENING BILATERAL MAMMOGRAM WITH IMPLANTS, CAD AND TOMO
The patient has retropectoral implants. Standard and implant
displaced views were performed.

[L MLO]
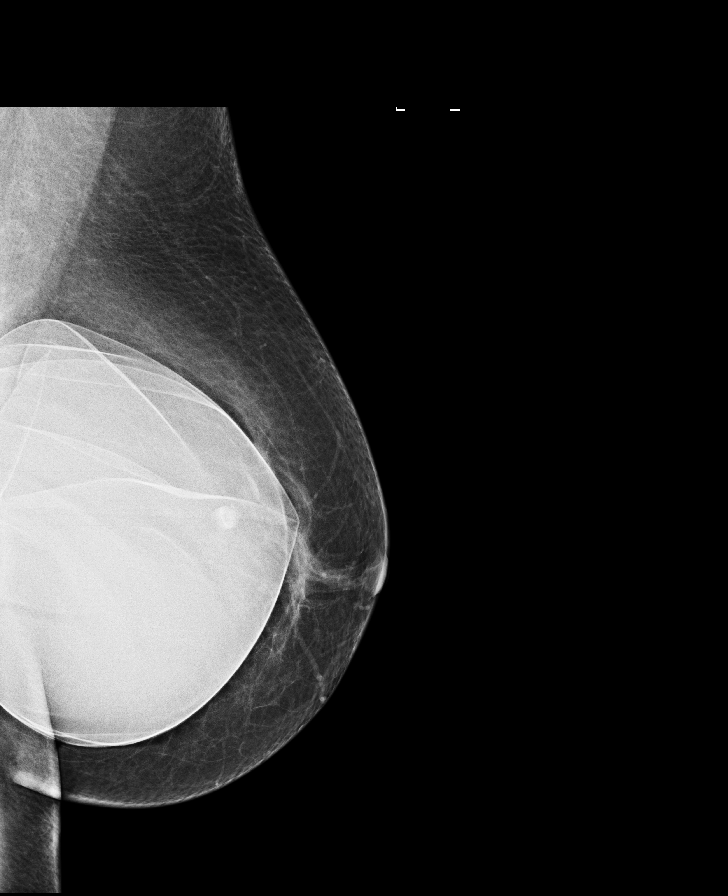

[L CC]
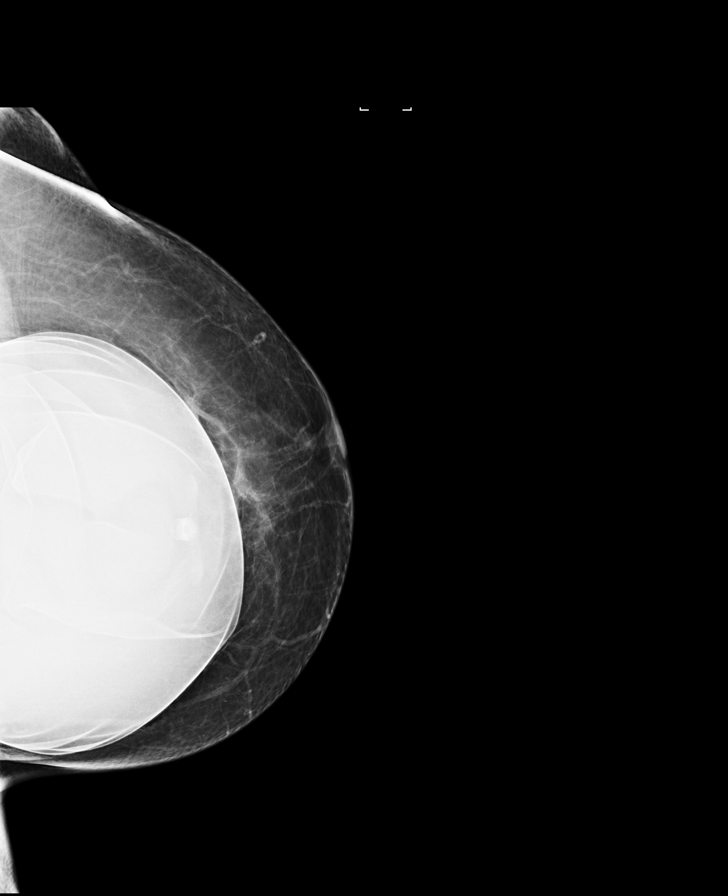

[R MLO]
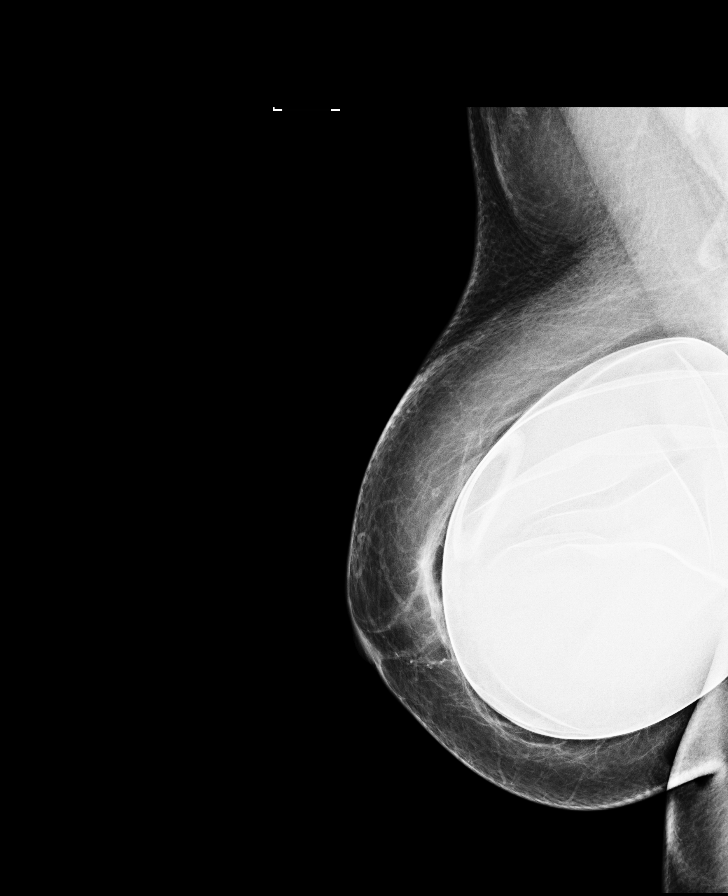

[R CC]
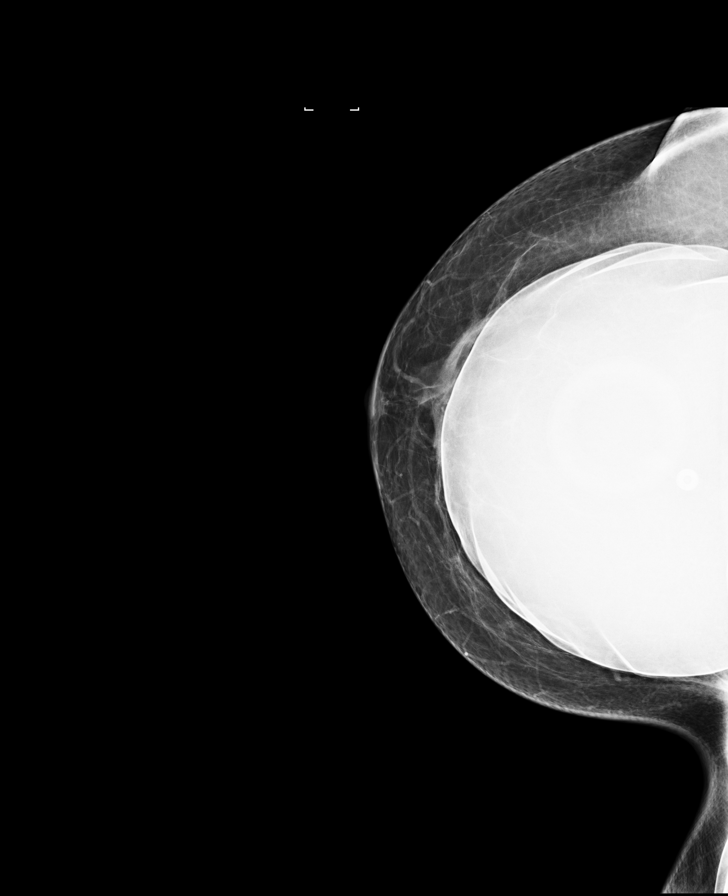

[L CC synth-2D]
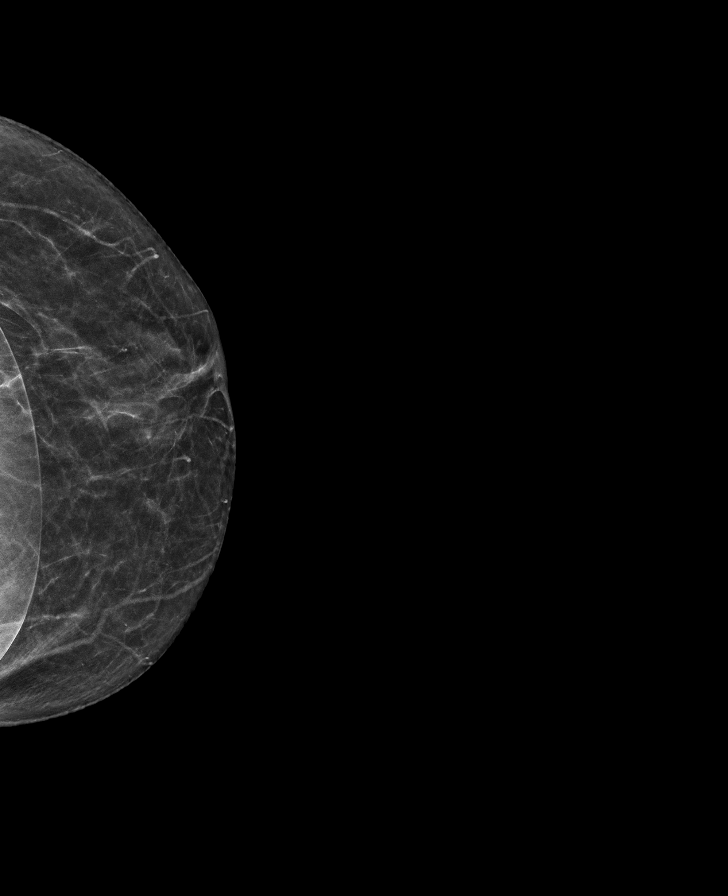

[R CC synth-2D]
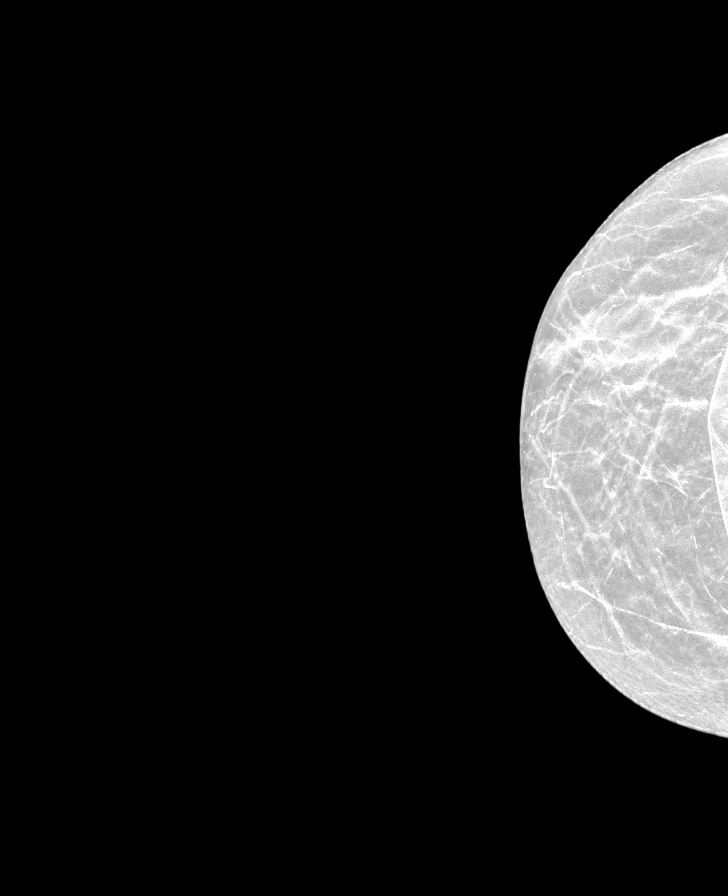

[R MLO synth-2D]
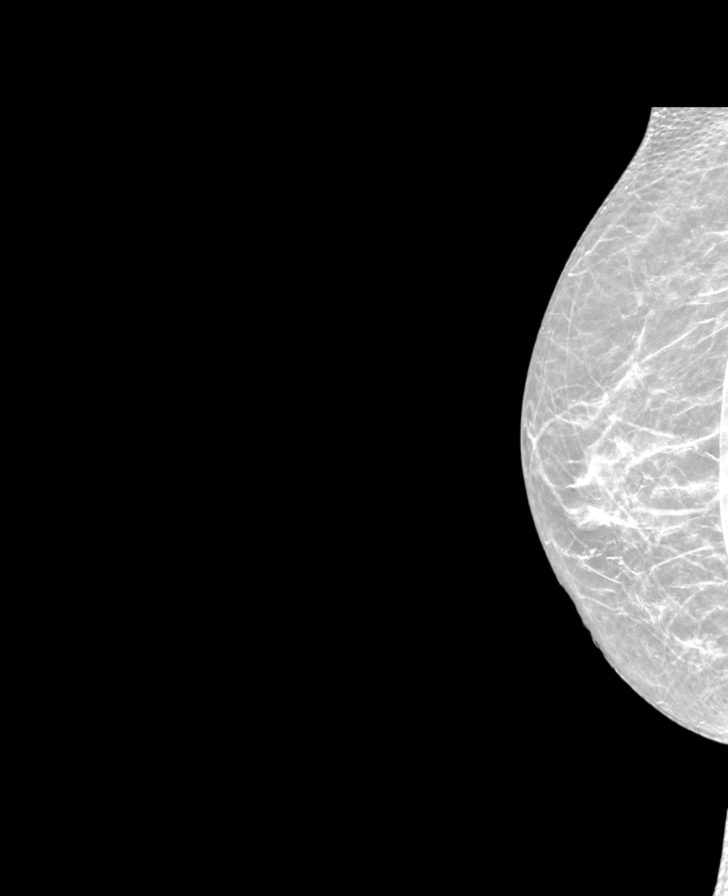

[L MLO synth-2D]
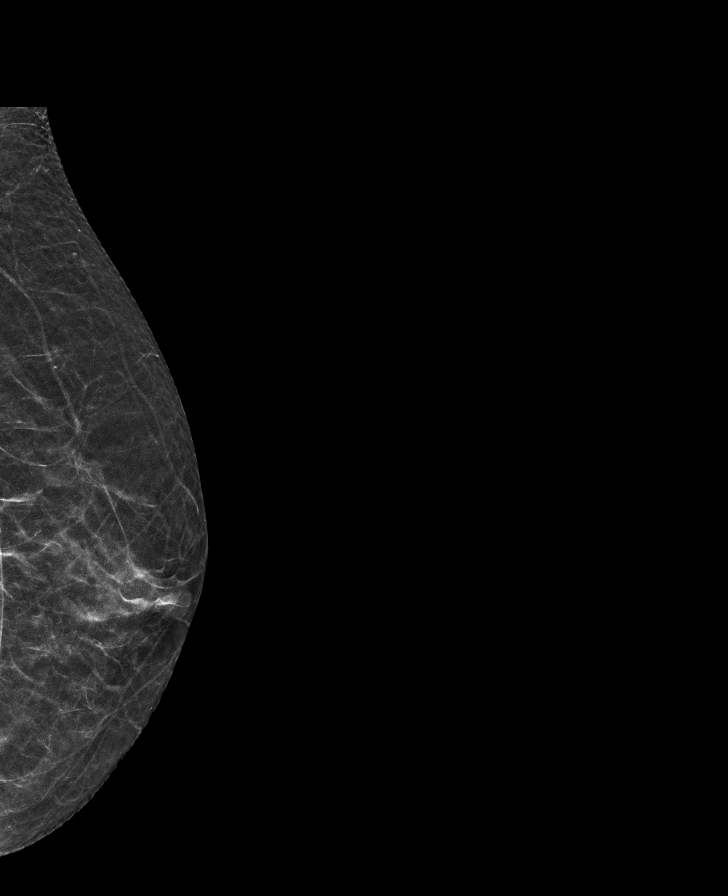

[8 of 28 positions shown; findings below may reference images not displayed]

ACR Breast Density Category b: There are scattered areas of
fibroglandular density.
FINDINGS: There are no findings suspicious for malignancy. Images were
processed with CAD.
IMPRESSION: No mammographic evidence of malignancy. A result letter of this
screening mammogram will be mailed directly to the patient.

RECOMMENDATION:
Screening mammogram in one year. (Code:[id number])

BI-RADS CATEGORY  1:  Negative.

## 2020-05-14 DIAGNOSIS — N390 Urinary tract infection, site not specified: Secondary | ICD-10-CM | POA: Diagnosis not present

## 2020-05-14 DIAGNOSIS — R350 Frequency of micturition: Secondary | ICD-10-CM | POA: Diagnosis not present

## 2020-06-05 ENCOUNTER — Other Ambulatory Visit: Payer: Self-pay | Admitting: Gastroenterology

## 2020-06-05 DIAGNOSIS — C8290 Follicular lymphoma, unspecified, unspecified site: Secondary | ICD-10-CM | POA: Diagnosis not present

## 2020-06-05 DIAGNOSIS — R1031 Right lower quadrant pain: Secondary | ICD-10-CM | POA: Diagnosis not present

## 2020-06-05 DIAGNOSIS — R197 Diarrhea, unspecified: Secondary | ICD-10-CM | POA: Diagnosis not present

## 2020-06-05 DIAGNOSIS — Z86010 Personal history of colonic polyps: Secondary | ICD-10-CM | POA: Diagnosis not present

## 2020-06-06 DIAGNOSIS — M810 Age-related osteoporosis without current pathological fracture: Secondary | ICD-10-CM | POA: Diagnosis not present

## 2020-06-17 ENCOUNTER — Ambulatory Visit
Admission: RE | Admit: 2020-06-17 | Discharge: 2020-06-17 | Disposition: A | Discharge status: Home / Self Care | Payer: Medicare Other | Source: Ambulatory Visit | Attending: Gastroenterology | Admitting: Gastroenterology

## 2020-06-17 ENCOUNTER — Other Ambulatory Visit: Payer: Self-pay

## 2020-06-17 DIAGNOSIS — R197 Diarrhea, unspecified: Secondary | ICD-10-CM

## 2020-06-17 DIAGNOSIS — K8020 Calculus of gallbladder without cholecystitis without obstruction: Secondary | ICD-10-CM | POA: Diagnosis not present

## 2020-06-17 DIAGNOSIS — N200 Calculus of kidney: Secondary | ICD-10-CM | POA: Diagnosis not present

## 2020-06-17 DIAGNOSIS — N281 Cyst of kidney, acquired: Secondary | ICD-10-CM | POA: Diagnosis not present

## 2020-06-17 DIAGNOSIS — K7689 Other specified diseases of liver: Secondary | ICD-10-CM | POA: Diagnosis not present

## 2020-06-17 DIAGNOSIS — R1031 Right lower quadrant pain: Secondary | ICD-10-CM

## 2020-06-17 MED ORDER — IOPAMIDOL (ISOVUE-300) INJECTION 61%
100.0000 mL | Freq: Once | INTRAVENOUS | Status: AC | PRN
  Administered 2020-06-17: 100 mL via INTRAVENOUS

## 2020-06-18 DIAGNOSIS — J479 Bronchiectasis, uncomplicated: Secondary | ICD-10-CM | POA: Diagnosis not present

## 2020-07-15 DIAGNOSIS — Z87440 Personal history of urinary (tract) infections: Secondary | ICD-10-CM | POA: Diagnosis not present

## 2020-07-15 DIAGNOSIS — R351 Nocturia: Secondary | ICD-10-CM | POA: Diagnosis not present

## 2020-07-15 DIAGNOSIS — R350 Frequency of micturition: Secondary | ICD-10-CM | POA: Diagnosis not present

## 2020-07-23 DIAGNOSIS — Z792 Long term (current) use of antibiotics: Secondary | ICD-10-CM | POA: Diagnosis not present

## 2020-07-23 DIAGNOSIS — Z87891 Personal history of nicotine dependence: Secondary | ICD-10-CM | POA: Diagnosis not present

## 2020-07-23 DIAGNOSIS — Z8616 Personal history of COVID-19: Secondary | ICD-10-CM | POA: Diagnosis not present

## 2020-07-23 DIAGNOSIS — C8223 Follicular lymphoma grade III, unspecified, intra-abdominal lymph nodes: Secondary | ICD-10-CM | POA: Diagnosis not present

## 2020-07-23 DIAGNOSIS — R918 Other nonspecific abnormal finding of lung field: Secondary | ICD-10-CM | POA: Diagnosis not present

## 2020-07-23 DIAGNOSIS — Z79899 Other long term (current) drug therapy: Secondary | ICD-10-CM | POA: Diagnosis not present

## 2020-07-23 DIAGNOSIS — C8290 Follicular lymphoma, unspecified, unspecified site: Secondary | ICD-10-CM | POA: Diagnosis not present

## 2020-08-01 DIAGNOSIS — M720 Palmar fascial fibromatosis [Dupuytren]: Secondary | ICD-10-CM | POA: Diagnosis not present

## 2020-08-07 DIAGNOSIS — M720 Palmar fascial fibromatosis [Dupuytren]: Secondary | ICD-10-CM | POA: Diagnosis not present

## 2020-08-07 DIAGNOSIS — I10 Essential (primary) hypertension: Secondary | ICD-10-CM | POA: Diagnosis not present

## 2020-08-15 DIAGNOSIS — M25641 Stiffness of right hand, not elsewhere classified: Secondary | ICD-10-CM | POA: Diagnosis not present

## 2020-08-15 DIAGNOSIS — M720 Palmar fascial fibromatosis [Dupuytren]: Secondary | ICD-10-CM | POA: Diagnosis not present

## 2020-08-15 DIAGNOSIS — Z09 Encounter for follow-up examination after completed treatment for conditions other than malignant neoplasm: Secondary | ICD-10-CM | POA: Diagnosis not present

## 2020-08-27 DIAGNOSIS — Z9889 Other specified postprocedural states: Secondary | ICD-10-CM | POA: Diagnosis not present

## 2020-08-27 DIAGNOSIS — M720 Palmar fascial fibromatosis [Dupuytren]: Secondary | ICD-10-CM | POA: Diagnosis not present

## 2020-08-27 DIAGNOSIS — M25641 Stiffness of right hand, not elsewhere classified: Secondary | ICD-10-CM | POA: Diagnosis not present

## 2020-08-28 DIAGNOSIS — L718 Other rosacea: Secondary | ICD-10-CM | POA: Diagnosis not present

## 2020-08-28 DIAGNOSIS — L814 Other melanin hyperpigmentation: Secondary | ICD-10-CM | POA: Diagnosis not present

## 2020-08-28 DIAGNOSIS — L738 Other specified follicular disorders: Secondary | ICD-10-CM | POA: Diagnosis not present

## 2020-08-28 DIAGNOSIS — D2271 Melanocytic nevi of right lower limb, including hip: Secondary | ICD-10-CM | POA: Diagnosis not present

## 2020-08-28 DIAGNOSIS — L820 Inflamed seborrheic keratosis: Secondary | ICD-10-CM | POA: Diagnosis not present

## 2020-08-28 DIAGNOSIS — L821 Other seborrheic keratosis: Secondary | ICD-10-CM | POA: Diagnosis not present

## 2020-08-28 DIAGNOSIS — D2371 Other benign neoplasm of skin of right lower limb, including hip: Secondary | ICD-10-CM | POA: Diagnosis not present

## 2020-09-03 DIAGNOSIS — M720 Palmar fascial fibromatosis [Dupuytren]: Secondary | ICD-10-CM | POA: Diagnosis not present

## 2020-09-23 DIAGNOSIS — E7800 Pure hypercholesterolemia, unspecified: Secondary | ICD-10-CM | POA: Diagnosis not present

## 2020-09-23 DIAGNOSIS — M810 Age-related osteoporosis without current pathological fracture: Secondary | ICD-10-CM | POA: Diagnosis not present

## 2020-09-23 DIAGNOSIS — I10 Essential (primary) hypertension: Secondary | ICD-10-CM | POA: Diagnosis not present

## 2020-09-23 DIAGNOSIS — R946 Abnormal results of thyroid function studies: Secondary | ICD-10-CM | POA: Diagnosis not present

## 2020-09-25 DIAGNOSIS — E559 Vitamin D deficiency, unspecified: Secondary | ICD-10-CM | POA: Diagnosis not present

## 2020-09-25 DIAGNOSIS — E7800 Pure hypercholesterolemia, unspecified: Secondary | ICD-10-CM | POA: Diagnosis not present

## 2020-09-25 DIAGNOSIS — F325 Major depressive disorder, single episode, in full remission: Secondary | ICD-10-CM | POA: Diagnosis not present

## 2020-09-25 DIAGNOSIS — Z0000 Encounter for general adult medical examination without abnormal findings: Secondary | ICD-10-CM | POA: Diagnosis not present

## 2020-09-25 DIAGNOSIS — I10 Essential (primary) hypertension: Secondary | ICD-10-CM | POA: Diagnosis not present

## 2020-09-25 DIAGNOSIS — M810 Age-related osteoporosis without current pathological fracture: Secondary | ICD-10-CM | POA: Diagnosis not present

## 2020-09-25 DIAGNOSIS — F419 Anxiety disorder, unspecified: Secondary | ICD-10-CM | POA: Diagnosis not present

## 2020-09-25 DIAGNOSIS — J449 Chronic obstructive pulmonary disease, unspecified: Secondary | ICD-10-CM | POA: Diagnosis not present

## 2020-09-25 DIAGNOSIS — G44219 Episodic tension-type headache, not intractable: Secondary | ICD-10-CM | POA: Diagnosis not present

## 2020-09-25 DIAGNOSIS — Z9109 Other allergy status, other than to drugs and biological substances: Secondary | ICD-10-CM | POA: Diagnosis not present

## 2020-09-25 DIAGNOSIS — K219 Gastro-esophageal reflux disease without esophagitis: Secondary | ICD-10-CM | POA: Diagnosis not present

## 2020-09-25 DIAGNOSIS — C8203 Follicular lymphoma grade I, intra-abdominal lymph nodes: Secondary | ICD-10-CM | POA: Diagnosis not present

## 2020-10-10 DIAGNOSIS — M720 Palmar fascial fibromatosis [Dupuytren]: Secondary | ICD-10-CM | POA: Diagnosis not present

## 2020-11-14 DIAGNOSIS — J432 Centrilobular emphysema: Secondary | ICD-10-CM | POA: Diagnosis not present

## 2020-11-14 DIAGNOSIS — C8220 Follicular lymphoma grade III, unspecified, unspecified site: Secondary | ICD-10-CM | POA: Diagnosis not present

## 2020-11-14 DIAGNOSIS — Z7961 Long term (current) use of immunomodulator: Secondary | ICD-10-CM | POA: Diagnosis not present

## 2020-11-14 DIAGNOSIS — K449 Diaphragmatic hernia without obstruction or gangrene: Secondary | ICD-10-CM | POA: Diagnosis not present

## 2020-11-14 DIAGNOSIS — Z87891 Personal history of nicotine dependence: Secondary | ICD-10-CM | POA: Diagnosis not present

## 2020-11-14 DIAGNOSIS — R197 Diarrhea, unspecified: Secondary | ICD-10-CM | POA: Diagnosis not present

## 2020-11-14 DIAGNOSIS — C8290 Follicular lymphoma, unspecified, unspecified site: Secondary | ICD-10-CM | POA: Diagnosis not present

## 2020-11-20 DIAGNOSIS — M720 Palmar fascial fibromatosis [Dupuytren]: Secondary | ICD-10-CM | POA: Diagnosis not present

## 2020-11-20 DIAGNOSIS — I10 Essential (primary) hypertension: Secondary | ICD-10-CM | POA: Diagnosis not present

## 2020-11-20 DIAGNOSIS — G8918 Other acute postprocedural pain: Secondary | ICD-10-CM | POA: Diagnosis not present

## 2020-12-02 DIAGNOSIS — M720 Palmar fascial fibromatosis [Dupuytren]: Secondary | ICD-10-CM | POA: Diagnosis not present

## 2020-12-03 DIAGNOSIS — M25642 Stiffness of left hand, not elsewhere classified: Secondary | ICD-10-CM | POA: Diagnosis not present

## 2020-12-03 DIAGNOSIS — Z9889 Other specified postprocedural states: Secondary | ICD-10-CM | POA: Diagnosis not present

## 2020-12-03 DIAGNOSIS — M25641 Stiffness of right hand, not elsewhere classified: Secondary | ICD-10-CM | POA: Diagnosis not present

## 2020-12-10 DIAGNOSIS — M810 Age-related osteoporosis without current pathological fracture: Secondary | ICD-10-CM | POA: Diagnosis not present

## 2020-12-12 DIAGNOSIS — M720 Palmar fascial fibromatosis [Dupuytren]: Secondary | ICD-10-CM | POA: Diagnosis not present

## 2020-12-26 DIAGNOSIS — Z09 Encounter for follow-up examination after completed treatment for conditions other than malignant neoplasm: Secondary | ICD-10-CM | POA: Diagnosis not present

## 2020-12-26 DIAGNOSIS — M25642 Stiffness of left hand, not elsewhere classified: Secondary | ICD-10-CM | POA: Diagnosis not present

## 2020-12-31 ENCOUNTER — Other Ambulatory Visit: Payer: Self-pay | Admitting: Family Medicine

## 2020-12-31 DIAGNOSIS — Z1231 Encounter for screening mammogram for malignant neoplasm of breast: Secondary | ICD-10-CM

## 2021-01-03 DIAGNOSIS — H40033 Anatomical narrow angle, bilateral: Secondary | ICD-10-CM | POA: Diagnosis not present

## 2021-01-03 DIAGNOSIS — H40053 Ocular hypertension, bilateral: Secondary | ICD-10-CM | POA: Diagnosis not present

## 2021-01-03 DIAGNOSIS — H2513 Age-related nuclear cataract, bilateral: Secondary | ICD-10-CM | POA: Diagnosis not present

## 2021-01-03 DIAGNOSIS — H43813 Vitreous degeneration, bilateral: Secondary | ICD-10-CM | POA: Diagnosis not present

## 2021-01-30 DIAGNOSIS — M720 Palmar fascial fibromatosis [Dupuytren]: Secondary | ICD-10-CM | POA: Diagnosis not present

## 2021-01-30 DIAGNOSIS — M25642 Stiffness of left hand, not elsewhere classified: Secondary | ICD-10-CM | POA: Diagnosis not present

## 2021-02-05 ENCOUNTER — Ambulatory Visit
Admission: RE | Admit: 2021-02-05 | Discharge: 2021-02-05 | Disposition: A | Discharge status: Home / Self Care | Payer: Medicare Other | Source: Ambulatory Visit | Attending: Family Medicine | Admitting: Family Medicine

## 2021-02-05 DIAGNOSIS — Z1231 Encounter for screening mammogram for malignant neoplasm of breast: Secondary | ICD-10-CM | POA: Diagnosis not present

## 2021-02-07 ENCOUNTER — Encounter: Payer: Self-pay | Admitting: Physician Assistant

## 2021-02-13 DIAGNOSIS — C8200 Follicular lymphoma grade I, unspecified site: Secondary | ICD-10-CM | POA: Diagnosis not present

## 2021-02-13 DIAGNOSIS — C8290 Follicular lymphoma, unspecified, unspecified site: Secondary | ICD-10-CM | POA: Diagnosis not present

## 2021-02-14 DIAGNOSIS — K52839 Microscopic colitis, unspecified: Secondary | ICD-10-CM | POA: Diagnosis not present

## 2021-02-14 DIAGNOSIS — K921 Melena: Secondary | ICD-10-CM | POA: Diagnosis not present

## 2021-02-14 DIAGNOSIS — R197 Diarrhea, unspecified: Secondary | ICD-10-CM | POA: Diagnosis not present

## 2021-02-24 ENCOUNTER — Ambulatory Visit: Payer: Medicare Other | Admitting: Physician Assistant

## 2021-03-04 DIAGNOSIS — K648 Other hemorrhoids: Secondary | ICD-10-CM | POA: Diagnosis not present

## 2021-03-04 DIAGNOSIS — K625 Hemorrhage of anus and rectum: Secondary | ICD-10-CM | POA: Diagnosis not present

## 2021-03-04 DIAGNOSIS — K5289 Other specified noninfective gastroenteritis and colitis: Secondary | ICD-10-CM | POA: Diagnosis not present

## 2021-03-04 DIAGNOSIS — K635 Polyp of colon: Secondary | ICD-10-CM | POA: Diagnosis not present

## 2021-03-11 DIAGNOSIS — K5289 Other specified noninfective gastroenteritis and colitis: Secondary | ICD-10-CM | POA: Diagnosis not present

## 2021-03-11 DIAGNOSIS — K635 Polyp of colon: Secondary | ICD-10-CM | POA: Diagnosis not present

## 2021-03-20 DIAGNOSIS — N993 Prolapse of vaginal vault after hysterectomy: Secondary | ICD-10-CM | POA: Diagnosis not present

## 2021-03-20 DIAGNOSIS — N3946 Mixed incontinence: Secondary | ICD-10-CM | POA: Diagnosis not present

## 2021-03-20 DIAGNOSIS — N819 Female genital prolapse, unspecified: Secondary | ICD-10-CM | POA: Diagnosis not present

## 2021-04-03 DIAGNOSIS — M720 Palmar fascial fibromatosis [Dupuytren]: Secondary | ICD-10-CM | POA: Diagnosis not present

## 2021-04-14 DIAGNOSIS — R6889 Other general symptoms and signs: Secondary | ICD-10-CM | POA: Diagnosis not present

## 2021-04-30 ENCOUNTER — Ambulatory Visit: Payer: Medicare Other | Admitting: Cardiology

## 2021-04-30 ENCOUNTER — Encounter: Payer: Self-pay | Admitting: Cardiology

## 2021-04-30 VITALS — BP 137/84 | HR 90 | Temp 98.0°F | Resp 16 | Ht 65.0 in | Wt 155.0 lb

## 2021-04-30 DIAGNOSIS — I700 Atherosclerosis of aorta: Secondary | ICD-10-CM

## 2021-04-30 DIAGNOSIS — I10 Essential (primary) hypertension: Secondary | ICD-10-CM

## 2021-04-30 DIAGNOSIS — E7800 Pure hypercholesterolemia, unspecified: Secondary | ICD-10-CM | POA: Diagnosis not present

## 2021-04-30 DIAGNOSIS — J479 Bronchiectasis, uncomplicated: Secondary | ICD-10-CM

## 2021-04-30 MED ORDER — OLMESARTAN MEDOXOMIL 40 MG PO TABS: 40.0000 mg | ORAL_TABLET | Freq: Every day | ORAL | 1 refills | Status: AC

## 2021-04-30 NOTE — Progress Notes (Signed)
? ?Primary Physician/Referring:  Janie Morning, DO ? ?Patient ID: Linward Natal, female    DOB: Dec 18, 1950, 71 y.o.   MRN: [id number] ? ?Chief Complaint  ?Patient presents with  ? Hypertension  ? New Patient (Initial Visit)  ?  Referred by Janie Morning, DO  ? ?HPI:   ? ?MARGRETT KALB  is a 71 y.o. Caucasian female patient with hypertension, hyperlipidemia, follicular lymphoma, COPD with cylindrical bronchiectasis, aortic atherosclerosis noted on the CT scan of the chest in 2021, depression referred to me for evaluation and management of hypertension, patient's husband also sees [place] in our practice. ? ?Patient has noticed elevated blood pressure in the range of 130 mmHg to 160 mmHg both in physician's offices and also at home.  She remains asymptomatic. ? ?Past Medical History:  ?Diagnosis Date  ? Abnormal CXR   ? Allergy   ? Broken ankle 12/10  ? atraumatic fracture   ? Cervicalgia   ? Chicken pox   ? Colon polyps   ? on 5 year recalldr Gamin   ? COPD (chronic obstructive pulmonary disease) ([place])   ? Depression   ? Encounter for long-term (current) use of other medications   ? GERD (gastroesophageal reflux disease)   ? H/O amblyopia   ? hx patching left eye  ? Hx of urinary stone   ? renal seen as incidental finding on x ray  ? Hx of varicella   ? Hyperlipemia   ? Hypertension   ? Migraine without aura   ? Migraines   ? Osteoporosis, unspecified   ? Unspecified vitamin D deficiency   ? ?Past Surgical History:  ?Procedure Laterality Date  ? [place]  ? plate fell   ? APPENDECTOMY  1997  ? AUGMENTATION MAMMAPLASTY  01/15/2014  ? BILATERAL SALPINGOOPHORECTOMY Bilateral spring 1999  ? 3 days after [facility] pathology was suspicious and went back with [facility] and had BSO  ? [place], 2003, 12/2013  ? x2; 2015 implants replaced  ? SHOULDER SURGERY Right 09/15/2013  ? TOTAL VAGINAL HYSTERECTOMY  spring 1999  ? menorrhagia; suspicious cells at cervix and fibroids   ? VESICOVAGINAL FISTULA  CLOSURE W/ [facility]  1997  ? ?Family History  ?Problem Relation Age of Onset  ? Hyperlipidemia Mother   ? Dementia Mother   ?     [place] dementia and Parkinson's  ? Heart disease Father   ?     died complications GB surgery perf and sepsis  ? Hypertension Father   ? Alcohol abuse Father   ? Hyperlipidemia Father   ? Dupuytren's contracture Father   ? Alcohol abuse Brother   ? Hyperlipidemia Brother   ? Hypertension Brother   ? Diabetes Brother   ? Dupuytren's contracture Brother   ? Alcohol abuse Brother   ? Hyperlipidemia Brother   ? Cervical cancer Paternal Aunt   ?     Cervical Cancer  ? Diabetes Maternal Grandmother   ? Heart disease Paternal Grandfather   ?  ?Social History  ? ?Tobacco Use  ? Smoking status: Former  ?  Packs/day: 1.00  ?  Years: 39.00  ?  Pack years: 39.00  ?  Types: Cigarettes  ?  Quit date: 12/27/2007  ?  Years since quitting: 13.3  ? Smokeless tobacco: Never  ?Substance Use Topics  ? Alcohol use: Yes  ?  Alcohol/week: 2.0 - 3.0 standard drinks  ?  Types: 2 - 3 Glasses of wine per week  ?  Comment: occasionally  ? ?Marital Status: Married  ?ROS  ?Review of Systems  ?Cardiovascular:  Negative for chest pain, dyspnea on exertion and leg swelling.  ?Objective  ?Blood pressure 137/84, pulse 90, temperature 98 ?F (36.7 ?C), temperature source Temporal, resp. rate 16, height _0  (1.651 m), weight 155 lb (70.3 kg), last menstrual period 01/27/1995, SpO2 99 %. Body mass index is 25.79 kg/m?.  ? ?  04/30/2021  ?  1:24 PM 02/13/2020  ?  3:08 PM 02/13/2020  ?  1:36 PM  ?Vitals with BMI  ?Height _1     ?Weight 155 lbs    ?BMI 25.79    ?Systolic 170 017 494  ?Diastolic 84 66 68  ?Pulse 90 65 82  ?  ?Physical Exam  ?Constitutional: No distress.  ?Eyes: Conjunctivae are normal.  ?Neck: No JVD present.  ?Cardiovascular: Regular rhythm, normal heart sounds, intact distal pulses and normal pulses. Exam reveals no gallop.  ?No murmur heard. ?Pulmonary/Chest: Effort normal and breath sounds normal. She exhibits no  tenderness.  ?Abdominal: Soft. Bowel sounds are normal.  ?Musculoskeletal:     ?   General: No edema. Normal range of motion.  ?   Cervical back: Neck supple.  ?Neurological: She is alert and oriented to person, place, and time.  ?Skin: Skin is warm.  ? ?Medications and allergies  ? ?Allergies  ?Allergen Reactions  ? Erythromycin Nausea Only and Nausea And Vomiting  ?  ? ?Medication list after today's encounter  ? ? ?Current Outpatient Medications:  ?  albuterol (PROVENTIL) (2.5 MG/3ML) 0.083% nebulizer solution, Take 3 mLs (2.5 mg total) by nebulization every 6 (six) hours as needed for wheezing or shortness of breath., Disp: 360 mL, Rfl: 11 ?  AMBULATORY NON FORMULARY MEDICATION, Medication Name: DME: [facility] DX: J44.9 Order # [id number] Please provide patient with nebulizer., Disp: 1 each, Rfl: 0 ?  aspirin 325 MG EC tablet, Take 325 mg by mouth daily. On revlamed for follicular lymphoma, Disp: , Rfl:  ?  atorvastatin (LIPITOR) 20 MG tablet, TAKE 1 TABLET (20 MG TOTAL) BY MOUTH DAILY. (Patient taking differently: Take 20 mg by mouth daily.), Disp: 90 tablet, Rfl: 0 ?  budesonide-formoterol (SYMBICORT) 80-4.5 MCG/ACT inhaler, Inhale into the lungs., Disp: , Rfl:  ?  Calcium Carb-Cholecalciferol (CALCIUM CARBONATE-VITAMIN D3) 600-400 MG-UNIT TABS, Take 1 tablet by mouth daily., Disp: , Rfl:  ?  calcium carbonate (OSCAL) 1500 (600 Ca) MG TABS tablet, Take by mouth., Disp: , Rfl:  ?  calcium-vitamin D (OSCAL WITH D) 500-200 MG-UNIT TABS tablet, Take by mouth., Disp: , Rfl:  ?  denosumab (PROLIA) 60 MG/ML SOSY injection, Inject 60 mg into the skin every 6 (six) months., Disp: , Rfl:  ?  doxycycline (VIBRA-TABS) 100 MG tablet, Take 100 mg by mouth 2 (two) times daily., Disp: , Rfl:  ?  ergocalciferol (VITAMIN D2) 1.25 MG (50000 UT) capsule, , Disp: , Rfl:  ?  fluticasone (FLONASE) 50 MCG/ACT nasal spray, Place 1 spray into both nostrils 2 (two) times daily. , Disp: , Rfl:  ?  montelukast (SINGULAIR) 10 MG  tablet, Take 10 mg by mouth daily., Disp: , Rfl:  ?  Multiple Vitamins-Minerals (CENTRUM SILVER PO), Take 1 tablet by mouth daily. Once daily , Disp: , Rfl:  ?  olmesartan (BENICAR) 40 MG tablet, Take 1 tablet (40 mg total) by mouth daily., Disp: 100 tablet, Rfl: 1 ?  omeprazole (PRILOSEC) 40 MG capsule, Take 40 mg by mouth daily., Disp: ,  Rfl:  ?  Respiratory Therapy Supplies (FLUTTER) DEVI, Use as directed, Disp: 1 each, Rfl: 0 ?  REVLIMID 10 MG capsule, Take by mouth., Disp: , Rfl:  ?  sodium chloride HYPERTONIC 3 % nebulizer solution, Take by nebulization 2 (two) times daily as needed for other., Disp: 240 mL, Rfl: 11 ?  sodium chloride HYPERTONIC 3 % nebulizer solution, TAKE BY NEBULIZATION 2 (TWO) TIMES DAILY AS NEEDED FOR OTHER., Disp: , Rfl:  ?  VIIBRYD 40 MG TABS, Take 40 mg by mouth daily., Disp: , Rfl:  ?  Vitamin D, Ergocalciferol, (DRISDOL) 1.25 MG (50000 UNIT) CAPS capsule, Take 50,000 Units by mouth once a week., Disp: , Rfl:  ?  buPROPion (WELLBUTRIN XL) 150 MG 24 hr tablet, Take 150 mg by mouth every morning., Disp: , Rfl:  ? ?Laboratory examination:  ? ?External labs:  ? ?Labs 09/23/2020: ? ?Total cholesterol 195, triglycerides 88, HDL 80, LDL 97, non-HDL cholesterol 115. ? ?A1c 5.5%.  TSH normal at 1.43.  Vitamin D44.1. ? ?Hb 13.5/HCT 38.9, platelets 260, normal indicis. ? ?Sodium 140, potassium 4.2, BUN 13, creatinine 0.71, EGFR 88 mL, LFTs normal. ? ?Radiology:  ? ?Low-dose lung cancer screening CT chest 10/09/2019: ?Cardiovascular: Heart size is normal. There is no significant pericardial fluid, thickening or pericardial calcification. Aortic atherosclerosis. No definite coronary artery calcifications. Mild calcifications of the mitral annulus. ?Widespread areas of cylindrical bronchiectasis, thickening of the peribronchovascular interstitium, regional architectural distortion and worsening peribronchovascular micro and macro nodularity ? ?PET Scan 07/23/2020: ?1.  No definite active lymphoma  ([place] [facility]).  ? ?2.  Redemonstrated clustered bilateral pulmonary nodules, including some  ?newly avid nodules in the right lung. While these are favored  ?inflammatory/infectious, continued follow up is recommend

## 2021-05-08 DIAGNOSIS — C8200 Follicular lymphoma grade I, unspecified site: Secondary | ICD-10-CM | POA: Diagnosis not present

## 2021-05-08 DIAGNOSIS — C8598 Non-Hodgkin lymphoma, unspecified, lymph nodes of multiple sites: Secondary | ICD-10-CM | POA: Diagnosis not present

## 2021-05-27 DIAGNOSIS — N993 Prolapse of vaginal vault after hysterectomy: Secondary | ICD-10-CM | POA: Diagnosis not present

## 2021-05-27 DIAGNOSIS — N3946 Mixed incontinence: Secondary | ICD-10-CM | POA: Diagnosis not present

## 2021-06-10 DIAGNOSIS — Z90710 Acquired absence of both cervix and uterus: Secondary | ICD-10-CM | POA: Diagnosis not present

## 2021-06-10 DIAGNOSIS — N736 Female pelvic peritoneal adhesions (postinfective): Secondary | ICD-10-CM | POA: Diagnosis not present

## 2021-06-10 DIAGNOSIS — E559 Vitamin D deficiency, unspecified: Secondary | ICD-10-CM | POA: Diagnosis not present

## 2021-06-10 DIAGNOSIS — Z7951 Long term (current) use of inhaled steroids: Secondary | ICD-10-CM | POA: Diagnosis not present

## 2021-06-10 DIAGNOSIS — I10 Essential (primary) hypertension: Secondary | ICD-10-CM | POA: Diagnosis not present

## 2021-06-10 DIAGNOSIS — N393 Stress incontinence (female) (male): Secondary | ICD-10-CM | POA: Diagnosis not present

## 2021-06-10 DIAGNOSIS — G5701 Lesion of sciatic nerve, right lower limb: Secondary | ICD-10-CM | POA: Diagnosis not present

## 2021-06-10 DIAGNOSIS — E785 Hyperlipidemia, unspecified: Secondary | ICD-10-CM | POA: Diagnosis not present

## 2021-06-10 DIAGNOSIS — N3946 Mixed incontinence: Secondary | ICD-10-CM | POA: Diagnosis not present

## 2021-06-10 DIAGNOSIS — N993 Prolapse of vaginal vault after hysterectomy: Secondary | ICD-10-CM | POA: Diagnosis not present

## 2021-06-10 DIAGNOSIS — J449 Chronic obstructive pulmonary disease, unspecified: Secondary | ICD-10-CM | POA: Diagnosis not present

## 2021-06-10 DIAGNOSIS — Z87891 Personal history of nicotine dependence: Secondary | ICD-10-CM | POA: Diagnosis not present

## 2021-06-10 DIAGNOSIS — Z79899 Other long term (current) drug therapy: Secondary | ICD-10-CM | POA: Diagnosis not present

## 2021-06-10 DIAGNOSIS — C8593 Non-Hodgkin lymphoma, unspecified, intra-abdominal lymph nodes: Secondary | ICD-10-CM | POA: Diagnosis not present

## 2021-06-10 DIAGNOSIS — E7800 Pure hypercholesterolemia, unspecified: Secondary | ICD-10-CM | POA: Diagnosis not present

## 2021-06-10 DIAGNOSIS — H40053 Ocular hypertension, bilateral: Secondary | ICD-10-CM | POA: Diagnosis not present

## 2021-06-10 DIAGNOSIS — K219 Gastro-esophageal reflux disease without esophagitis: Secondary | ICD-10-CM | POA: Diagnosis not present

## 2021-06-10 DIAGNOSIS — M810 Age-related osteoporosis without current pathological fracture: Secondary | ICD-10-CM | POA: Diagnosis not present

## 2021-06-19 DIAGNOSIS — M810 Age-related osteoporosis without current pathological fracture: Secondary | ICD-10-CM | POA: Diagnosis not present

## 2021-08-07 DIAGNOSIS — C8293 Follicular lymphoma, unspecified, intra-abdominal lymph nodes: Secondary | ICD-10-CM | POA: Diagnosis not present

## 2021-08-07 DIAGNOSIS — C8200 Follicular lymphoma grade I, unspecified site: Secondary | ICD-10-CM | POA: Diagnosis not present

## 2021-08-07 DIAGNOSIS — C8598 Non-Hodgkin lymphoma, unspecified, lymph nodes of multiple sites: Secondary | ICD-10-CM | POA: Diagnosis not present

## 2021-08-07 DIAGNOSIS — Z87891 Personal history of nicotine dependence: Secondary | ICD-10-CM | POA: Diagnosis not present

## 2021-08-07 DIAGNOSIS — C8290 Follicular lymphoma, unspecified, unspecified site: Secondary | ICD-10-CM | POA: Diagnosis not present

## 2021-08-07 DIAGNOSIS — C155 Malignant neoplasm of lower third of esophagus: Secondary | ICD-10-CM | POA: Diagnosis not present

## 2021-08-15 DIAGNOSIS — C8293 Follicular lymphoma, unspecified, intra-abdominal lymph nodes: Secondary | ICD-10-CM | POA: Diagnosis not present

## 2021-08-15 DIAGNOSIS — K259 Gastric ulcer, unspecified as acute or chronic, without hemorrhage or perforation: Secondary | ICD-10-CM | POA: Diagnosis not present

## 2021-08-15 DIAGNOSIS — R935 Abnormal findings on diagnostic imaging of other abdominal regions, including retroperitoneum: Secondary | ICD-10-CM | POA: Diagnosis not present

## 2021-08-15 DIAGNOSIS — Z90721 Acquired absence of ovaries, unilateral: Secondary | ICD-10-CM | POA: Diagnosis not present

## 2021-08-15 DIAGNOSIS — Z881 Allergy status to other antibiotic agents status: Secondary | ICD-10-CM | POA: Diagnosis not present

## 2021-08-15 DIAGNOSIS — J9859 Other diseases of mediastinum, not elsewhere classified: Secondary | ICD-10-CM | POA: Diagnosis not present

## 2021-08-15 DIAGNOSIS — K3189 Other diseases of stomach and duodenum: Secondary | ICD-10-CM | POA: Diagnosis not present

## 2021-08-15 DIAGNOSIS — C8290 Follicular lymphoma, unspecified, unspecified site: Secondary | ICD-10-CM | POA: Diagnosis not present

## 2021-08-15 DIAGNOSIS — K219 Gastro-esophageal reflux disease without esophagitis: Secondary | ICD-10-CM | POA: Diagnosis not present

## 2021-08-15 DIAGNOSIS — Z9049 Acquired absence of other specified parts of digestive tract: Secondary | ICD-10-CM | POA: Diagnosis not present

## 2021-08-15 DIAGNOSIS — C8202 Follicular lymphoma grade I, intrathoracic lymph nodes: Secondary | ICD-10-CM | POA: Diagnosis not present

## 2021-08-15 DIAGNOSIS — Z87891 Personal history of nicotine dependence: Secondary | ICD-10-CM | POA: Diagnosis not present

## 2021-08-15 DIAGNOSIS — Z90710 Acquired absence of both cervix and uterus: Secondary | ICD-10-CM | POA: Diagnosis not present

## 2021-08-15 DIAGNOSIS — K2970 Gastritis, unspecified, without bleeding: Secondary | ICD-10-CM | POA: Diagnosis not present

## 2021-08-15 DIAGNOSIS — R948 Abnormal results of function studies of other organs and systems: Secondary | ICD-10-CM | POA: Diagnosis not present

## 2021-08-20 DIAGNOSIS — J479 Bronchiectasis, uncomplicated: Secondary | ICD-10-CM | POA: Diagnosis not present

## 2021-08-26 DIAGNOSIS — C8290 Follicular lymphoma, unspecified, unspecified site: Secondary | ICD-10-CM | POA: Diagnosis not present

## 2021-09-02 DIAGNOSIS — Z23 Encounter for immunization: Secondary | ICD-10-CM | POA: Diagnosis not present

## 2021-09-02 DIAGNOSIS — Z5111 Encounter for antineoplastic chemotherapy: Secondary | ICD-10-CM | POA: Diagnosis not present

## 2021-09-02 DIAGNOSIS — C8290 Follicular lymphoma, unspecified, unspecified site: Secondary | ICD-10-CM | POA: Diagnosis not present

## 2021-09-02 DIAGNOSIS — C8293 Follicular lymphoma, unspecified, intra-abdominal lymph nodes: Secondary | ICD-10-CM | POA: Diagnosis not present

## 2021-09-02 DIAGNOSIS — Z87891 Personal history of nicotine dependence: Secondary | ICD-10-CM | POA: Diagnosis not present

## 2021-09-02 DIAGNOSIS — Z79899 Other long term (current) drug therapy: Secondary | ICD-10-CM | POA: Diagnosis not present

## 2021-09-02 DIAGNOSIS — Z5112 Encounter for antineoplastic immunotherapy: Secondary | ICD-10-CM | POA: Diagnosis not present

## 2021-09-03 DIAGNOSIS — Z5111 Encounter for antineoplastic chemotherapy: Secondary | ICD-10-CM | POA: Diagnosis not present

## 2021-09-03 DIAGNOSIS — C8293 Follicular lymphoma, unspecified, intra-abdominal lymph nodes: Secondary | ICD-10-CM | POA: Diagnosis not present

## 2021-09-30 DIAGNOSIS — Z87891 Personal history of nicotine dependence: Secondary | ICD-10-CM | POA: Diagnosis not present

## 2021-09-30 DIAGNOSIS — Z79899 Other long term (current) drug therapy: Secondary | ICD-10-CM | POA: Diagnosis not present

## 2021-09-30 DIAGNOSIS — C8298 Follicular lymphoma, unspecified, lymph nodes of multiple sites: Secondary | ICD-10-CM | POA: Diagnosis not present

## 2021-09-30 DIAGNOSIS — Z5111 Encounter for antineoplastic chemotherapy: Secondary | ICD-10-CM | POA: Diagnosis not present

## 2021-09-30 DIAGNOSIS — C8293 Follicular lymphoma, unspecified, intra-abdominal lymph nodes: Secondary | ICD-10-CM | POA: Diagnosis not present

## 2021-09-30 DIAGNOSIS — Z23 Encounter for immunization: Secondary | ICD-10-CM | POA: Diagnosis not present

## 2021-09-30 DIAGNOSIS — C8290 Follicular lymphoma, unspecified, unspecified site: Secondary | ICD-10-CM | POA: Diagnosis not present

## 2021-09-30 DIAGNOSIS — Z5112 Encounter for antineoplastic immunotherapy: Secondary | ICD-10-CM | POA: Diagnosis not present

## 2021-10-01 DIAGNOSIS — C8290 Follicular lymphoma, unspecified, unspecified site: Secondary | ICD-10-CM | POA: Diagnosis not present

## 2021-10-02 DIAGNOSIS — E559 Vitamin D deficiency, unspecified: Secondary | ICD-10-CM | POA: Diagnosis not present

## 2021-10-02 DIAGNOSIS — R7309 Other abnormal glucose: Secondary | ICD-10-CM | POA: Diagnosis not present

## 2021-10-02 DIAGNOSIS — R946 Abnormal results of thyroid function studies: Secondary | ICD-10-CM | POA: Diagnosis not present

## 2021-10-02 DIAGNOSIS — I10 Essential (primary) hypertension: Secondary | ICD-10-CM | POA: Diagnosis not present

## 2021-10-02 DIAGNOSIS — E7800 Pure hypercholesterolemia, unspecified: Secondary | ICD-10-CM | POA: Diagnosis not present

## 2021-10-09 DIAGNOSIS — Z9109 Other allergy status, other than to drugs and biological substances: Secondary | ICD-10-CM | POA: Diagnosis not present

## 2021-10-09 DIAGNOSIS — Z0000 Encounter for general adult medical examination without abnormal findings: Secondary | ICD-10-CM | POA: Diagnosis not present

## 2021-10-09 DIAGNOSIS — C8202 Follicular lymphoma grade I, intrathoracic lymph nodes: Secondary | ICD-10-CM | POA: Diagnosis not present

## 2021-10-09 DIAGNOSIS — I10 Essential (primary) hypertension: Secondary | ICD-10-CM | POA: Diagnosis not present

## 2021-10-09 DIAGNOSIS — F325 Major depressive disorder, single episode, in full remission: Secondary | ICD-10-CM | POA: Diagnosis not present

## 2021-10-09 DIAGNOSIS — E7800 Pure hypercholesterolemia, unspecified: Secondary | ICD-10-CM | POA: Diagnosis not present

## 2021-10-09 DIAGNOSIS — E559 Vitamin D deficiency, unspecified: Secondary | ICD-10-CM | POA: Diagnosis not present

## 2021-10-09 DIAGNOSIS — R946 Abnormal results of thyroid function studies: Secondary | ICD-10-CM | POA: Diagnosis not present

## 2021-10-09 DIAGNOSIS — R7309 Other abnormal glucose: Secondary | ICD-10-CM | POA: Diagnosis not present

## 2021-10-09 DIAGNOSIS — F419 Anxiety disorder, unspecified: Secondary | ICD-10-CM | POA: Diagnosis not present

## 2021-10-09 DIAGNOSIS — J439 Emphysema, unspecified: Secondary | ICD-10-CM | POA: Diagnosis not present

## 2021-10-09 DIAGNOSIS — M810 Age-related osteoporosis without current pathological fracture: Secondary | ICD-10-CM | POA: Diagnosis not present

## 2021-10-15 DIAGNOSIS — C8290 Follicular lymphoma, unspecified, unspecified site: Secondary | ICD-10-CM | POA: Diagnosis not present

## 2021-10-15 DIAGNOSIS — K5289 Other specified noninfective gastroenteritis and colitis: Secondary | ICD-10-CM | POA: Diagnosis not present

## 2021-10-15 DIAGNOSIS — K2970 Gastritis, unspecified, without bleeding: Secondary | ICD-10-CM | POA: Diagnosis not present

## 2021-10-23 DIAGNOSIS — C8290 Follicular lymphoma, unspecified, unspecified site: Secondary | ICD-10-CM | POA: Diagnosis not present

## 2021-10-23 DIAGNOSIS — T451X5A Adverse effect of antineoplastic and immunosuppressive drugs, initial encounter: Secondary | ICD-10-CM | POA: Diagnosis not present

## 2021-10-23 DIAGNOSIS — C8292 Follicular lymphoma, unspecified, intrathoracic lymph nodes: Secondary | ICD-10-CM | POA: Diagnosis not present

## 2021-10-23 DIAGNOSIS — D6481 Anemia due to antineoplastic chemotherapy: Secondary | ICD-10-CM | POA: Diagnosis not present

## 2021-10-23 DIAGNOSIS — Z5112 Encounter for antineoplastic immunotherapy: Secondary | ICD-10-CM | POA: Diagnosis not present

## 2021-10-23 DIAGNOSIS — Z5111 Encounter for antineoplastic chemotherapy: Secondary | ICD-10-CM | POA: Diagnosis not present

## 2021-10-23 DIAGNOSIS — Z23 Encounter for immunization: Secondary | ICD-10-CM | POA: Diagnosis not present

## 2021-10-24 DIAGNOSIS — C8290 Follicular lymphoma, unspecified, unspecified site: Secondary | ICD-10-CM | POA: Diagnosis not present

## 2021-11-11 DIAGNOSIS — Z23 Encounter for immunization: Secondary | ICD-10-CM | POA: Diagnosis not present

## 2021-11-18 DIAGNOSIS — C8298 Follicular lymphoma, unspecified, lymph nodes of multiple sites: Secondary | ICD-10-CM | POA: Diagnosis not present

## 2021-11-18 DIAGNOSIS — Z79624 Long term (current) use of inhibitors of nucleotide synthesis: Secondary | ICD-10-CM | POA: Diagnosis not present

## 2021-11-18 DIAGNOSIS — R590 Localized enlarged lymph nodes: Secondary | ICD-10-CM | POA: Diagnosis not present

## 2021-11-18 DIAGNOSIS — C8290 Follicular lymphoma, unspecified, unspecified site: Secondary | ICD-10-CM | POA: Diagnosis not present

## 2021-11-18 DIAGNOSIS — Z87891 Personal history of nicotine dependence: Secondary | ICD-10-CM | POA: Diagnosis not present

## 2021-11-18 DIAGNOSIS — Z20822 Contact with and (suspected) exposure to covid-19: Secondary | ICD-10-CM | POA: Diagnosis not present

## 2021-11-18 DIAGNOSIS — N200 Calculus of kidney: Secondary | ICD-10-CM | POA: Diagnosis not present

## 2021-11-18 DIAGNOSIS — Z79899 Other long term (current) drug therapy: Secondary | ICD-10-CM | POA: Diagnosis not present

## 2021-11-18 DIAGNOSIS — K8020 Calculus of gallbladder without cholecystitis without obstruction: Secondary | ICD-10-CM | POA: Diagnosis not present

## 2021-11-18 DIAGNOSIS — F1090 Alcohol use, unspecified, uncomplicated: Secondary | ICD-10-CM | POA: Diagnosis not present

## 2021-11-18 DIAGNOSIS — I700 Atherosclerosis of aorta: Secondary | ICD-10-CM | POA: Diagnosis not present

## 2021-11-18 DIAGNOSIS — J432 Centrilobular emphysema: Secondary | ICD-10-CM | POA: Diagnosis not present

## 2021-11-18 DIAGNOSIS — J9811 Atelectasis: Secondary | ICD-10-CM | POA: Diagnosis not present

## 2021-11-18 DIAGNOSIS — R918 Other nonspecific abnormal finding of lung field: Secondary | ICD-10-CM | POA: Diagnosis not present

## 2021-11-18 DIAGNOSIS — Z7982 Long term (current) use of aspirin: Secondary | ICD-10-CM | POA: Diagnosis not present

## 2021-12-10 DIAGNOSIS — K52832 Lymphocytic colitis: Secondary | ICD-10-CM | POA: Diagnosis not present

## 2021-12-10 DIAGNOSIS — K219 Gastro-esophageal reflux disease without esophagitis: Secondary | ICD-10-CM | POA: Diagnosis not present

## 2021-12-22 DIAGNOSIS — M810 Age-related osteoporosis without current pathological fracture: Secondary | ICD-10-CM | POA: Diagnosis not present

## 2021-12-23 DIAGNOSIS — Z79630 Long term (current) use of alkylating agent: Secondary | ICD-10-CM | POA: Diagnosis not present

## 2021-12-23 DIAGNOSIS — Z87891 Personal history of nicotine dependence: Secondary | ICD-10-CM | POA: Diagnosis not present

## 2021-12-23 DIAGNOSIS — Z79620 Long term (current) use of immunosuppressive biologic: Secondary | ICD-10-CM | POA: Diagnosis not present

## 2021-12-23 DIAGNOSIS — J9811 Atelectasis: Secondary | ICD-10-CM | POA: Diagnosis not present

## 2021-12-23 DIAGNOSIS — C8290 Follicular lymphoma, unspecified, unspecified site: Secondary | ICD-10-CM | POA: Diagnosis not present

## 2021-12-23 DIAGNOSIS — Z5112 Encounter for antineoplastic immunotherapy: Secondary | ICD-10-CM | POA: Diagnosis not present

## 2021-12-23 DIAGNOSIS — Z5111 Encounter for antineoplastic chemotherapy: Secondary | ICD-10-CM | POA: Diagnosis not present

## 2022-01-07 ENCOUNTER — Other Ambulatory Visit: Payer: Self-pay | Admitting: Family Medicine

## 2022-01-07 ENCOUNTER — Other Ambulatory Visit: Payer: Self-pay

## 2022-01-07 DIAGNOSIS — Z1231 Encounter for screening mammogram for malignant neoplasm of breast: Secondary | ICD-10-CM

## 2022-01-08 DIAGNOSIS — H43813 Vitreous degeneration, bilateral: Secondary | ICD-10-CM | POA: Diagnosis not present

## 2022-01-08 DIAGNOSIS — H2513 Age-related nuclear cataract, bilateral: Secondary | ICD-10-CM | POA: Diagnosis not present

## 2022-01-08 DIAGNOSIS — H40033 Anatomical narrow angle, bilateral: Secondary | ICD-10-CM | POA: Diagnosis not present

## 2022-01-08 DIAGNOSIS — H40053 Ocular hypertension, bilateral: Secondary | ICD-10-CM | POA: Diagnosis not present

## 2022-01-08 DIAGNOSIS — C8290 Follicular lymphoma, unspecified, unspecified site: Secondary | ICD-10-CM | POA: Diagnosis not present

## 2022-01-09 DIAGNOSIS — J479 Bronchiectasis, uncomplicated: Secondary | ICD-10-CM | POA: Diagnosis not present

## 2022-03-06 ENCOUNTER — Ambulatory Visit
Admission: RE | Admit: 2022-03-06 | Discharge: 2022-03-06 | Disposition: A | Discharge status: Home / Self Care | Payer: Medicare Other | Source: Ambulatory Visit | Attending: Family Medicine | Admitting: Family Medicine

## 2022-03-06 DIAGNOSIS — Z1231 Encounter for screening mammogram for malignant neoplasm of breast: Secondary | ICD-10-CM

## 2022-03-11 ENCOUNTER — Telehealth: Payer: Self-pay

## 2022-03-11 NOTE — Patient Instructions (Signed)
Visit Information  Thank you for taking time to visit with me today. Please don't hesitate to contact me if I can be of assistance to you.   Following are the goals we discussed today:   Goals Addressed             This Visit's Progress    COMPLETED: Care Coordination Activities-No follow up required       Interventions Today    Flowsheet Row Most Recent Value  General Interventions   General Interventions Discussed/Reviewed General Interventions Discussed, Doctor Visits, Health Screening  Doctor Visits Discussed/Reviewed Doctor Visits Discussed, Annual Wellness Visits  Health Screening Colonoscopy, Mammogram                If you are experiencing a Mental Health or [place] or need someone to talk to, please call the Suicide and Crisis Lifeline: 988   Patient verbalizes understanding of instructions and care plan provided today and agrees to view in [place]. Active MyChart status and patient understanding of how to access instructions and care plan via MyChart confirmed with patient.     No further follow up required: decline  Jone Baseman, RN, MSN [facility] Care Management Coordinator Direct Line [phone]

## 2022-03-11 NOTE — Patient Outreach (Signed)
  Care Coordination   Initial Visit Note   03/11/2022 Name: Yolanda Copeland MRN: [id number] DOB: [birth year]  Yolanda Copeland is a 72 y.o. year old female who sees Janie Morning, [place] for primary care. I spoke with  Yolanda Copeland by phone today.  What matters to the patients health and wellness today?  none    Goals Addressed             This Visit's Progress    COMPLETED: Care Coordination Activities-No follow up required       Interventions Today    Flowsheet Row Most Recent Value  General Interventions   General Interventions Discussed/Reviewed General Interventions Discussed, Doctor Visits, Health Screening  Doctor Visits Discussed/Reviewed Doctor Visits Discussed, Annual Wellness Visits  Health Screening Colonoscopy, Mammogram              SDOH assessments and interventions completed:  Yes  SDOH Interventions Today    Flowsheet Row Most Recent Value  SDOH Interventions   Food Insecurity Interventions Intervention Not Indicated  Housing Interventions Intervention Not Indicated  Transportation Interventions Intervention Not Indicated        Care Coordination Interventions:  Yes, provided   Follow up plan: No further intervention required.   Encounter Outcome:  Pt. Visit Completed   Jone Baseman, RN, MSN [place] [facility] Care Management Coordinator Direct Line [phone]

## 2022-03-27 ENCOUNTER — Ambulatory Visit: Payer: Self-pay

## 2022-03-27 ENCOUNTER — Encounter: Payer: Medicare Other | Admitting: Nurse Practitioner

## 2022-03-27 ENCOUNTER — Telehealth: Payer: Medicare Other

## 2022-03-27 NOTE — Telephone Encounter (Signed)
  Chief Complaint: covid positive Symptoms: wheezing, fever, chills, muscle aches Frequency: sx started 03/26/22 Pertinent Negatives: Patient denies chest pain,  Disposition: '[]'$ ED /'[]'$ Urgent Care (no appt availability in office) / '[]'$ Appointment(In office/virtual)/ '[x]'$  [facility]/ '[]'$ Home Care/ '[]'$ Refused Recommended Disposition /'[]'$ [place] [facility]/ '[]'$  Follow-up with PCP Additional Notes: PCP is off on Fridays- pt has low immune sytsem due to cancer-  Reason for Disposition  [1] HIGH RISK patient (e.g., weak immune system, age > 76 years, obesity with BMI 30 or higher, pregnant, chronic lung disease or other chronic medical condition) AND [2] COVID symptoms (e.g., cough, fever)  (Exceptions: Already seen by PCP and no new or worsening symptoms.)  Answer Assessment - Initial Assessment Questions 1. COVID-19 DIAGNOSIS: "How do you know that you have COVID?" (e.g., positive lab test or self-test, diagnosed by doctor or NP/PA, symptoms after exposure).     Self test  2. COVID-19 EXPOSURE: "Was there any known exposure to COVID before the symptoms began?" CDC Definition of close contact: within 6 feet (2 meters) for a total of 15 minutes or more over a 24-hour period.      yes 3. ONSET: "When did the COVID-19 symptoms start?"      03/26/22 4. WORST SYMPTOM: "What is your worst symptom?" (e.g., cough, fever, shortness of breath, muscle aches)      Frequent cough with small amount thick clear phlegm  5. COUGH: "Do you have a cough?" If Yes, ask: "How bad is the cough?"       Yes- clear phlegm --frequent cough  6. FEVER: "Do you have a fever?" If Yes, ask: "What is your temperature, how was it measured, and when did it start?"     100.5 7. RESPIRATORY STATUS: "Describe your breathing?" (e.g., normal; shortness of breath, wheezing, unable to speak)      SOB and wheezing, -  8. BETTER-SAME-WORSE: "Are you getting better, staying the same or getting worse compared to yesterday?"  If  getting worse, ask, "In what way?"     worse 9. OTHER SYMPTOMS: "Do you have any other symptoms?"  (e.g., chills, fatigue, headache, loss of smell or taste, muscle pain, sore throat)     Achy, chills, muscle pain, scratchy throat, sore throat 10. HIGH RISK DISEASE: "Do you have any chronic medical problems?" (e.g., asthma, heart or lung disease, weak immune system, obesity, etc.)       Weak immune  11. VACCINE: "Have you had the COVID-19 vaccine?" If Yes, ask: "Which one, how many shots, when did you get it?"       yes  Protocols used: Coronavirus (COVID-19) Diagnosed or Suspected-A-AH

## 2022-03-27 NOTE — Progress Notes (Signed)
Gerald Stabs,  Based on your medical history and current diagnosis of COVID we feel it is best if you are at least seen over a Video if not in person.   If you would like to schedule a Video Visit go back to your Mychart and choose Virtual Video Visit instructions are below. If you prefer Urgent Care we will copy addresses below as well   Thank you    If you convert to a video visit, we will bill your insurance (similar to an office visit) and you will not be charged for this e-Visit. You will be able to stay at home and speak with the first available [facility] advanced practice provider. The link to do a video visit is in the drop down Menu tab of your Welcome screen in [place].    NOTE: There will be NO CHARGE for this eVisit     NEW!! [place] [facility] [place] at [place] Get Driving Directions [id number] [address], [address] [place], [place] [facility] [place] [facility] [place] Get Driving Directions [id number] [place], [place] [address]   [place] [facility] [place] [facility]) Get Driving Directions [id number] [address], [place] [address]  [place] [facility] [place] ([place]) Get Driving Directions [id number] [address] [place],  [place]  [address]  [place] [facility] [place] [facility] - at [place] Get Driving Directions  [phone] [address] [place],  [place] [address]   [facility]  Get Driving Directions [id number] [address], [place], [place] [address]   [facility] [place] Get Driving Directions  [id number] [address] [place], [place] [address]   [facility] [place] Get Driving Directions [id number] [address] [place], [place] [address]  Your MyChart E-visit  questionnaire answers were reviewed by a board certified advanced clinical practitioner to complete your personal care plan based on your specific symptoms.  Thank you for using e-Visits.

## 2023-01-21 ENCOUNTER — Other Ambulatory Visit: Payer: Self-pay | Admitting: Family Medicine

## 2023-01-21 DIAGNOSIS — Z1231 Encounter for screening mammogram for malignant neoplasm of breast: Secondary | ICD-10-CM

## 2023-03-08 DIAGNOSIS — Z1231 Encounter for screening mammogram for malignant neoplasm of breast: Secondary | ICD-10-CM

## 2023-06-07 ENCOUNTER — Encounter (HOSPITAL_COMMUNITY): Payer: Self-pay

## 2023-06-14 ENCOUNTER — Ambulatory Visit
Admission: RE | Admit: 2023-06-14 | Discharge: 2023-06-14 | Disposition: A | Discharge status: Home / Self Care | Source: Ambulatory Visit | Attending: Family Medicine | Admitting: Family Medicine

## 2023-06-14 DIAGNOSIS — Z1231 Encounter for screening mammogram for malignant neoplasm of breast: Secondary | ICD-10-CM

## 2023-08-13 ENCOUNTER — Encounter: Payer: Self-pay | Admitting: Advanced Practice Midwife
